# Patient Record
Sex: Female | Born: 2000 | Race: Black or African American | Hispanic: No | Marital: Single | Smoking: Never smoker
Health system: Southern US, Community
[De-identification: ages and names within clinical notes are randomized; demographics above are authoritative.]

## PROBLEM LIST (undated history)

## (undated) DIAGNOSIS — J302 Other seasonal allergic rhinitis: Secondary | ICD-10-CM

## (undated) HISTORY — PX: OTHER SURGICAL HISTORY: SHX169

---
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
## 2019-01-31 DIAGNOSIS — H5213 Myopia, bilateral: Secondary | ICD-10-CM | POA: Diagnosis not present

## 2019-02-03 DIAGNOSIS — H5213 Myopia, bilateral: Secondary | ICD-10-CM | POA: Diagnosis not present

## 2019-02-10 DIAGNOSIS — Z113 Encounter for screening for infections with a predominantly sexual mode of transmission: Secondary | ICD-10-CM | POA: Diagnosis not present

## 2019-02-10 DIAGNOSIS — Z30014 Encounter for initial prescription of intrauterine contraceptive device: Secondary | ICD-10-CM | POA: Diagnosis not present

## 2019-03-10 DIAGNOSIS — H5213 Myopia, bilateral: Secondary | ICD-10-CM | POA: Diagnosis not present

## 2019-03-10 DIAGNOSIS — H52223 Regular astigmatism, bilateral: Secondary | ICD-10-CM | POA: Diagnosis not present

## 2019-05-31 DIAGNOSIS — Z20822 Contact with and (suspected) exposure to covid-19: Secondary | ICD-10-CM | POA: Diagnosis not present

## 2020-06-16 DIAGNOSIS — Z20822 Contact with and (suspected) exposure to covid-19: Secondary | ICD-10-CM | POA: Diagnosis not present

## 2020-06-20 ENCOUNTER — Emergency Department (HOSPITAL_COMMUNITY)
Admission: EM | Admit: 2020-06-20 | Discharge: 2020-06-20 | Disposition: A | Discharge status: Home / Self Care | Payer: Medicaid Other | Attending: Emergency Medicine | Admitting: Emergency Medicine

## 2020-06-20 ENCOUNTER — Other Ambulatory Visit: Payer: Self-pay

## 2020-06-20 ENCOUNTER — Encounter (HOSPITAL_COMMUNITY): Payer: Self-pay

## 2020-06-20 DIAGNOSIS — L02213 Cutaneous abscess of chest wall: Secondary | ICD-10-CM | POA: Insufficient documentation

## 2020-06-20 MED ORDER — SULFAMETHOXAZOLE-TRIMETHOPRIM 800-160 MG PO TABS: 1.0000 | ORAL_TABLET | Freq: Two times a day (BID) | ORAL | 0 refills | Status: AC

## 2020-06-20 MED ORDER — SULFAMETHOXAZOLE-TRIMETHOPRIM 800-160 MG PO TABS
1.0000 | ORAL_TABLET | Freq: Once | ORAL | Status: AC
  Administered 2020-06-20: 1 via ORAL
  Filled 2020-06-20: qty 1

## 2020-06-20 NOTE — ED Provider Notes (Signed)
[facility]-EMERGENCY DEPT Provider Note   CSN: [id number] Arrival date & time: 06/20/20  2016     History Chief Complaint  Patient presents with  . Breast Pain    [facility] is a 20 y.o. female with past medical history who presents for evaluation of left breast pain intermittently x3 months.  States she feels like intermittently there is a lump on the left mid central chest wall.  States he has not noted a change in size.  Today became painful.  She denies any redness, swelling or warmth to her breast.  No prior pregnancies or breast-feeding.  She has not noted any changes or inversion to her nipples.  No history of breast cancer in early age. Pain worse when she "wear a tight bra." Rates her current pain a 4/10.  She has not taken anything for this.  She denies fever, chills, nausea, vomiting, chest pain, shortness of breath, cough, hemoptysis abdominal pain, diarrhea, dysuria, unilateral leg swelling, redness or warmth.  Denies additional aggravating or alleviating factors.  History obtained from patient and past medical records.  No interpreter used.  HPI     Past Medical History:  Diagnosis Date  . Seasonal allergies     There are no problems to display for this patient.   Past Surgical History:  Procedure Laterality Date  . tubes in ears       OB History   No obstetric history on file.     History reviewed. No pertinent family history.  Social History   Tobacco Use  . Smoking status: Never Smoker  . Smokeless tobacco: Never Used  Vaping Use  . Vaping Use: Never used  Substance Use Topics  . Alcohol use: Not Currently  . Drug use: Not Currently    Home Medications Prior to Admission medications   Medication Sig Start Date End Date Taking? Authorizing Provider  sulfamethoxazole-trimethoprim (BACTRIM DS) 800-160 MG tablet Take 1 tablet by mouth 2 (two) times daily for 7 days. 06/20/20 06/27/20 Yes Benny Deutschman A, PA-C   acetaminophen (TYLENOL) 100 MG/ML solution Take 10 mg/kg by mouth every 4 (four) hours as needed.    [provider]  famotidine (PEPCID) 20 MG tablet Take 1 tablet (20 mg total) by mouth 2 (two) times daily. 06/02/16   Lowanda Foster, NP    Allergies    Patient has no known allergies.  Review of Systems   Review of Systems  Constitutional: Negative.   HENT: Negative.   Respiratory: Negative.   Cardiovascular:       Left breast pain  Gastrointestinal: Negative.   Genitourinary: Negative.   Musculoskeletal: Negative.   Skin: Negative.   Neurological: Negative.   All other systems reviewed and are negative.   Physical Exam Updated Vital Signs BP 140/80 (BP Location: Left Arm)   Pulse 87   Temp 98.3 F (36.8 C) (Oral)   Resp 16   Ht 5\' 7"  (1.702 m)   Wt 118.4 kg   LMP 05/31/2020   SpO2 99%   BMI 40.88 kg/m   Physical Exam Vitals and nursing note reviewed. Exam conducted with a chaperone present.  Constitutional:      General: She is not in acute distress.    Appearance: She is well-developed. She is not ill-appearing, toxic-appearing or diaphoretic.  HENT:     Head: Normocephalic and atraumatic.     Nose: Nose normal.     Mouth/Throat:     Mouth: Mucous membranes are moist.  Eyes:     Pupils: Pupils are equal, round, and reactive to light.  Cardiovascular:     Rate and Rhythm: Normal rate.     Pulses: Normal pulses.     Heart sounds: Normal heart sounds.  Pulmonary:     Effort: Pulmonary effort is normal. No respiratory distress.     Breath sounds: Normal breath sounds.  Chest:     Chest wall: Tenderness present. No lacerations or edema.  Breasts:     Right: No swelling, bleeding, inverted nipple, mass, nipple discharge, skin change, tenderness, axillary adenopathy or supraclavicular adenopathy.     Left: No swelling, bleeding, inverted nipple, mass, nipple discharge, skin change, tenderness, axillary adenopathy or supraclavicular adenopathy.         Comments: RN present for exam. 1 cm firm mobile, tenderness mass to left central breast crease.  No fluctuance.  No bleeding or drainage Abdominal:     General: There is no distension.  Musculoskeletal:        General: Normal range of motion.     Cervical back: Normal range of motion.  Lymphadenopathy:     Upper Body:     Right upper body: No supraclavicular, axillary or pectoral adenopathy.     Left upper body: No supraclavicular, axillary or pectoral adenopathy.  Skin:    General: Skin is warm and dry.     Capillary Refill: Capillary refill takes less than 2 seconds.     Comments: 1cm firm, mobile erythematous mass to the left central breast crease at chest wall.  No fluctuance. Overlying erythema present.  Neurological:     General: No focal deficit present.     Mental Status: She is alert and oriented to person, place, and time.     ED Results / Procedures / Treatments   Labs (all labs ordered are listed, but only abnormal results are displayed) Labs Reviewed - No data to display  EKG None  Radiology No results found.  Procedures Procedures   Medications Ordered in ED Medications  sulfamethoxazole-trimethoprim (BACTRIM DS) 800-160 MG per tablet 1 tablet (has no administration in time range)    ED Course  I have reviewed the triage vital signs and the nursing notes.  Pertinent labs & imaging results that were available during my care of the patient were reviewed by me and considered in my medical decision making (see chart for details).  20 year old female presents for evaluation of left breast pain. She is afebrile, nonseptic, not ill changes.  1cm firm, mobile mass to left breast crease with surrounding erythema and tenderness.  No fluctuance to suggest drainable abscess.  Seems more consistent with chest wall infected cyst vs early non drainable abscess.  No nipple inversion, peu de orange, large abscess on exam. Given antibiotics here in ED.  Discussed warm compress,  antibiotics and follow-up in 2 days for wound recheck.  She is agreeable.  The patient has been appropriately medically screened and/or stabilized in the ED. I have low suspicion for any other emergent medical condition which would require further screening, evaluation or treatment in the ED or require inpatient management.  Patient is hemodynamically stable and in no acute distress.  Patient able to ambulate in department prior to ED.  Evaluation does not show acute pathology that would require ongoing or additional emergent interventions while in the emergency department or further inpatient treatment.  I have discussed the diagnosis with the patient and answered all questions.  Pain is been managed while in the emergency  department and patient has no further complaints prior to discharge.  Patient is comfortable with plan discussed in room and is stable for discharge at this time.  I have discussed strict return precautions for returning to the emergency department.  Patient was encouraged to follow-up with PCP/specialist refer to at discharge.    MDM Rules/Calculators/A&P                           Final Clinical Impression(s) / ED Diagnoses Final diagnoses:  Chest wall abscess    Rx / DC Orders ED Discharge Orders         Ordered    sulfamethoxazole-trimethoprim (BACTRIM DS) 800-160 MG tablet  2 times daily        06/20/20 2116           Ollie Delano A, PA-C 06/20/20 2117    Linwood Dibbles, MD 06/21/20 [phone]

## 2020-06-20 NOTE — Discharge Instructions (Signed)
Take the antibiotics as prescribed  Warm compress to area  This area will typically come to ahead  Follow-up in 2 days with primary care provider  May take Tylenol and ibuprofen as needed for pain.  Do not take more than 4000 mg Tylenol or more than 1200 mg ibuprofen in a 24.  Return for new or worsening symptoms.

## 2020-06-20 NOTE — ED Triage Notes (Signed)
Pt c/o breast pain x several months, states it feels like there is a lump on the left side. Denies fevers, states it hasn't changed in size but has been more painful recently

## 2020-06-21 ENCOUNTER — Telehealth: Payer: Self-pay

## 2020-06-21 NOTE — Telephone Encounter (Signed)
Transition Care Management Unsuccessful Follow-up Telephone Call  Date of discharge and from where:  03./23/2022 from [facility]  Attempts:  1st Attempt  Reason for unsuccessful TCM follow-up call:  Unable to leave message

## 2020-06-22 NOTE — Telephone Encounter (Signed)
Transition Care Management Unsuccessful Follow-up Telephone Call  Date of discharge and from where:  06/20/2020 from [facility]  Attempts:  2nd Attempt  Reason for unsuccessful TCM follow-up call:  Unable to leave message

## 2020-06-25 NOTE — Telephone Encounter (Signed)
Transition Care Management Unsuccessful Follow-up Telephone Call  Date of discharge and from where:  06/20/2020 [facility]  Attempts:  3rd Attempt  Reason for unsuccessful TCM follow-up call:  Unable to leave message

## 2020-10-24 DIAGNOSIS — Z113 Encounter for screening for infections with a predominantly sexual mode of transmission: Secondary | ICD-10-CM | POA: Diagnosis not present

## 2020-10-24 DIAGNOSIS — Z114 Encounter for screening for human immunodeficiency virus [HIV]: Secondary | ICD-10-CM | POA: Diagnosis not present

## 2020-10-24 DIAGNOSIS — B373 Candidiasis of vulva and vagina: Secondary | ICD-10-CM | POA: Diagnosis not present

## 2020-12-30 DIAGNOSIS — I10 Essential (primary) hypertension: Secondary | ICD-10-CM | POA: Insufficient documentation

## 2020-12-30 DIAGNOSIS — R200 Anesthesia of skin: Secondary | ICD-10-CM | POA: Diagnosis not present

## 2020-12-30 DIAGNOSIS — R202 Paresthesia of skin: Secondary | ICD-10-CM | POA: Diagnosis not present

## 2020-12-30 DIAGNOSIS — R600 Localized edema: Secondary | ICD-10-CM | POA: Diagnosis not present

## 2020-12-30 DIAGNOSIS — G5711 Meralgia paresthetica, right lower limb: Secondary | ICD-10-CM | POA: Insufficient documentation

## 2020-12-30 DIAGNOSIS — F444 Conversion disorder with motor symptom or deficit: Secondary | ICD-10-CM | POA: Diagnosis not present

## 2020-12-30 DIAGNOSIS — M79604 Pain in right leg: Secondary | ICD-10-CM | POA: Diagnosis present

## 2020-12-30 DIAGNOSIS — M4804 Spinal stenosis, thoracic region: Secondary | ICD-10-CM | POA: Diagnosis not present

## 2020-12-30 DIAGNOSIS — D649 Anemia, unspecified: Secondary | ICD-10-CM | POA: Insufficient documentation

## 2020-12-30 DIAGNOSIS — F419 Anxiety disorder, unspecified: Secondary | ICD-10-CM | POA: Insufficient documentation

## 2020-12-30 DIAGNOSIS — Z20822 Contact with and (suspected) exposure to covid-19: Secondary | ICD-10-CM | POA: Insufficient documentation

## 2020-12-30 DIAGNOSIS — G35 Multiple sclerosis: Secondary | ICD-10-CM | POA: Diagnosis not present

## 2020-12-31 ENCOUNTER — Inpatient Hospital Stay (HOSPITAL_COMMUNITY): Payer: Medicaid Other

## 2020-12-31 ENCOUNTER — Emergency Department (HOSPITAL_COMMUNITY): Payer: Medicaid Other

## 2020-12-31 ENCOUNTER — Observation Stay (HOSPITAL_COMMUNITY)
Admission: EM | Admit: 2020-12-31 | Discharge: 2021-01-01 | Disposition: A | Discharge status: Home / Self Care | Payer: Medicaid Other | Attending: Internal Medicine | Admitting: Internal Medicine

## 2020-12-31 ENCOUNTER — Other Ambulatory Visit: Payer: Self-pay

## 2020-12-31 ENCOUNTER — Encounter (HOSPITAL_COMMUNITY): Payer: Self-pay | Admitting: Emergency Medicine

## 2020-12-31 DIAGNOSIS — M791 Myalgia, unspecified site: Secondary | ICD-10-CM

## 2020-12-31 DIAGNOSIS — M79605 Pain in left leg: Secondary | ICD-10-CM | POA: Diagnosis not present

## 2020-12-31 DIAGNOSIS — M79662 Pain in left lower leg: Secondary | ICD-10-CM | POA: Diagnosis not present

## 2020-12-31 DIAGNOSIS — M79604 Pain in right leg: Secondary | ICD-10-CM | POA: Diagnosis present

## 2020-12-31 DIAGNOSIS — M4804 Spinal stenosis, thoracic region: Secondary | ICD-10-CM | POA: Diagnosis not present

## 2020-12-31 DIAGNOSIS — M545 Low back pain, unspecified: Secondary | ICD-10-CM | POA: Diagnosis not present

## 2020-12-31 DIAGNOSIS — G35 Multiple sclerosis: Secondary | ICD-10-CM | POA: Diagnosis not present

## 2020-12-31 DIAGNOSIS — M79661 Pain in right lower leg: Secondary | ICD-10-CM | POA: Diagnosis not present

## 2020-12-31 DIAGNOSIS — R29898 Other symptoms and signs involving the musculoskeletal system: Secondary | ICD-10-CM

## 2020-12-31 DIAGNOSIS — M79606 Pain in leg, unspecified: Secondary | ICD-10-CM | POA: Diagnosis not present

## 2020-12-31 DIAGNOSIS — R200 Anesthesia of skin: Secondary | ICD-10-CM

## 2020-12-31 DIAGNOSIS — I10 Essential (primary) hypertension: Secondary | ICD-10-CM | POA: Diagnosis not present

## 2020-12-31 DIAGNOSIS — R600 Localized edema: Secondary | ICD-10-CM | POA: Diagnosis not present

## 2020-12-31 LAB — BASIC METABOLIC PANEL
Anion gap: 6 (ref 5–15)
BUN: 11 mg/dL (ref 6–20)
CO2: 24 mmol/L (ref 22–32)
Calcium: 9.3 mg/dL (ref 8.9–10.3)
Chloride: 104 mmol/L (ref 98–111)
Creatinine, Ser: 0.66 mg/dL (ref 0.44–1.00)
GFR, Estimated: >60 mL/min (ref >60)
Glucose, Bld: 89 mg/dL (ref 70–99)
Potassium: 4.3 mmol/L (ref 3.5–5.1)
Sodium: 134 mmol/L — ABNORMAL LOW (ref 135–145)

## 2020-12-31 LAB — FOLATE: Folate: 16.4 ng/mL (ref >5.9)

## 2020-12-31 LAB — IRON AND TIBC
Iron: 97 ug/dL (ref 28–170)
Saturation Ratios: 18 % (ref 10.4–31.8)
TIBC: 528 ug/dL — ABNORMAL HIGH (ref 250–450)
UIBC: 431 ug/dL

## 2020-12-31 LAB — HEPATIC FUNCTION PANEL
ALT: 14 U/L (ref 0–44)
AST: 17 U/L (ref 15–41)
Albumin: 3.8 g/dL (ref 3.5–5.0)
Alkaline Phosphatase: 58 U/L (ref 38–126)
Bilirubin, Direct: 0.1 mg/dL (ref 0.0–0.2)
Indirect Bilirubin: 1.1 mg/dL — ABNORMAL HIGH (ref 0.3–0.9)
Total Bilirubin: 1.2 mg/dL (ref 0.3–1.2)
Total Protein: 7.6 g/dL (ref 6.5–8.1)

## 2020-12-31 LAB — CBC
HCT: 35.9 % — ABNORMAL LOW (ref 36.0–46.0)
Hemoglobin: 11 g/dL — ABNORMAL LOW (ref 12.0–15.0)
MCH: 24.4 pg — ABNORMAL LOW (ref 26.0–34.0)
MCHC: 30.6 g/dL (ref 30.0–36.0)
MCV: 79.6 fL — ABNORMAL LOW (ref 80.0–100.0)
Platelets: 360 10*3/uL (ref 150–400)
RBC: 4.51 MIL/uL (ref 3.87–5.11)
RDW: 14.9 % (ref 11.5–15.5)
WBC: 9.7 10*3/uL (ref 4.0–10.5)
nRBC: 0 % (ref 0.0–0.2)

## 2020-12-31 LAB — HIV ANTIBODY (ROUTINE TESTING W REFLEX): HIV Screen 4th Generation wRfx: NONREACTIVE

## 2020-12-31 LAB — I-STAT BETA HCG BLOOD, ED (MC, WL, AP ONLY): I-stat hCG, quantitative: <5 m[IU]/mL (ref <5)

## 2020-12-31 LAB — CK: Total CK: 91 U/L (ref 38–234)

## 2020-12-31 LAB — RESP PANEL BY RT-PCR (FLU A&B, COVID) ARPGX2
Influenza A by PCR: NEGATIVE
Influenza B by PCR: NEGATIVE
SARS Coronavirus 2 by RT PCR: NEGATIVE

## 2020-12-31 LAB — FERRITIN: Ferritin: 14 ng/mL (ref 11–307)

## 2020-12-31 LAB — VITAMIN B12: Vitamin B-12: 402 pg/mL (ref 180–914)

## 2020-12-31 LAB — HEMOGLOBIN A1C
Hgb A1c MFr Bld: 5.1 % (ref 4.8–5.6)
Mean Plasma Glucose: 99.67 mg/dL

## 2020-12-31 LAB — TSH: TSH: 2.033 u[IU]/mL (ref 0.350–4.500)

## 2020-12-31 IMAGING — MR MR THORACIC SPINE WO/W CM
4 of 9 series · 18 of 48 positions shown · IV contrast (gadavist)
Comparison: Chest radiographs [DATE].

CLINICAL DATA: Spinal stenosis. Suspicion of demyelinating disease.
Left leg numbness for 1 week with right-sided symptoms today.

EXAM:
MRI CERVICAL AND THORACIC SPINE WITHOUT AND WITH CONTRAST
TECHNIQUE: Multiplanar and multiecho pulse sequences of the cervical spine, to
include the craniocervical junction and cervicothoracic junction,
and the thoracic spine, were obtained without and with intravenous
contrast.
CONTRAST:  10mL GADAVIST GADOBUTROL 1 MMOL/ML IV SOLN

[Series 3: T1 · sagittal · 3.5mm · 0.90mm/px · 4 of 15 slices shown (1 of 2)]
[im 1/15]
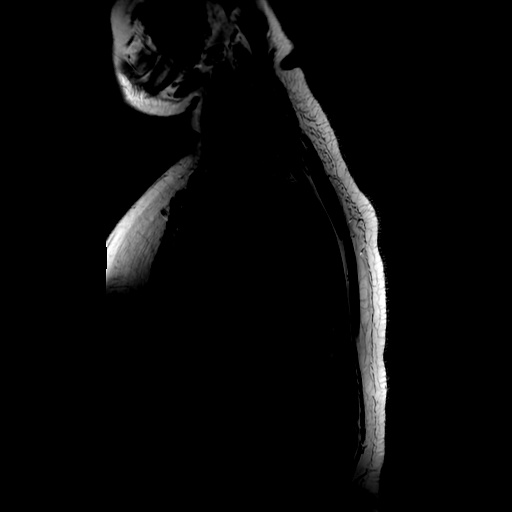
[im 5/15]
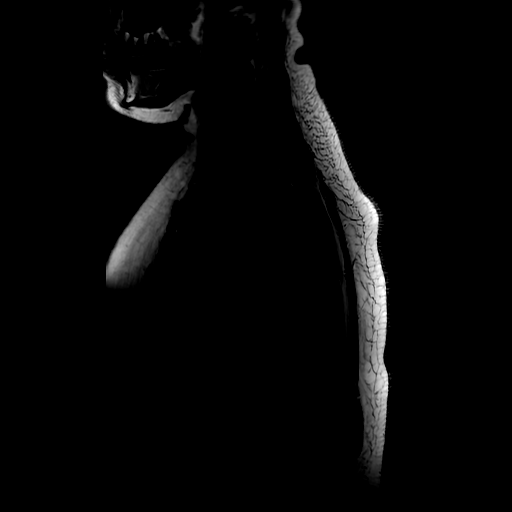
[im 10/15]
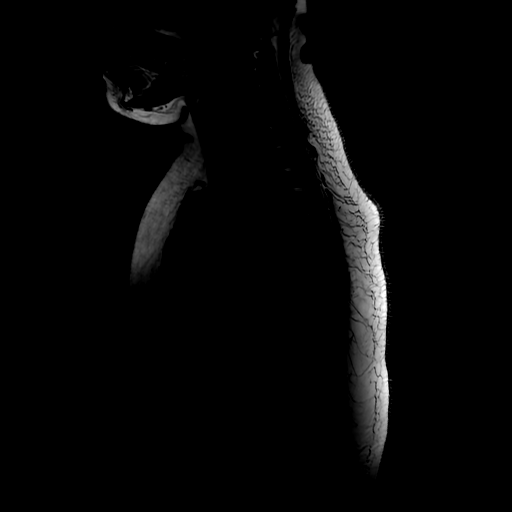
[im 15/15]
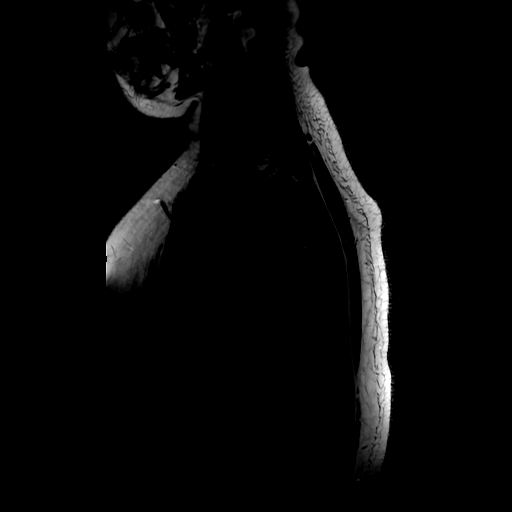

[Series 19: T2 · sagittal · 3.0mm · 0.66mm/px · 3 of 15 slices shown (1 of 2)]
[im 1/15]
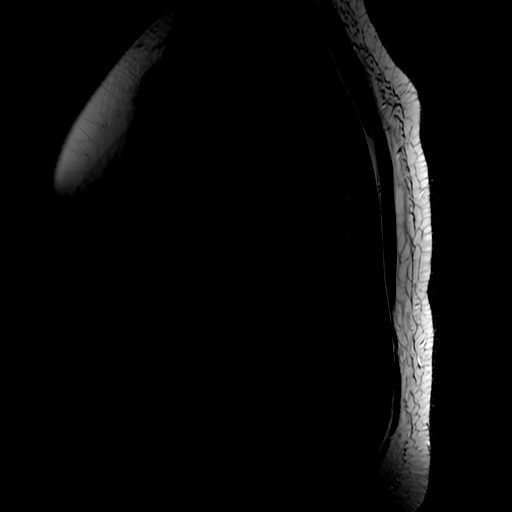
[im 8/15]
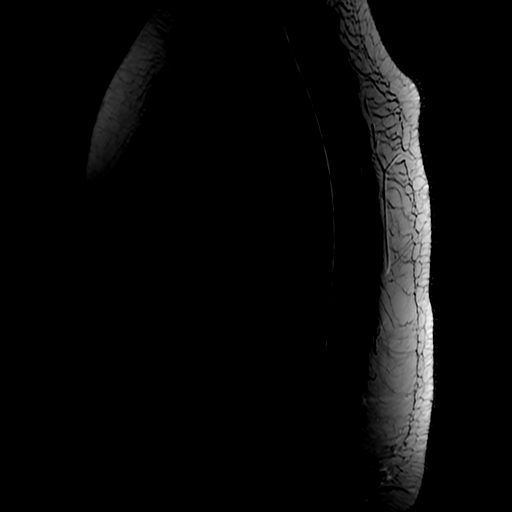
[im 15/15]
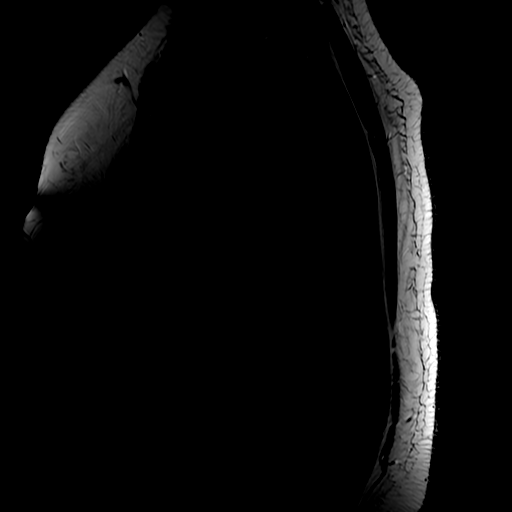

[Series 21: T1 · sagittal · 3.0mm · 0.66mm/px · 3 of 15 slices shown (2 of 2)]
[im 1/15]
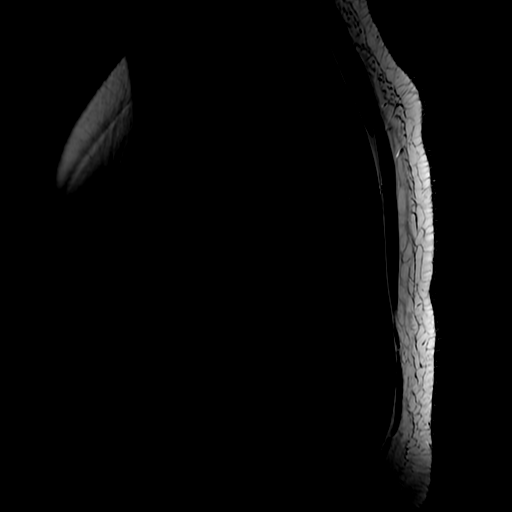
[im 8/15]
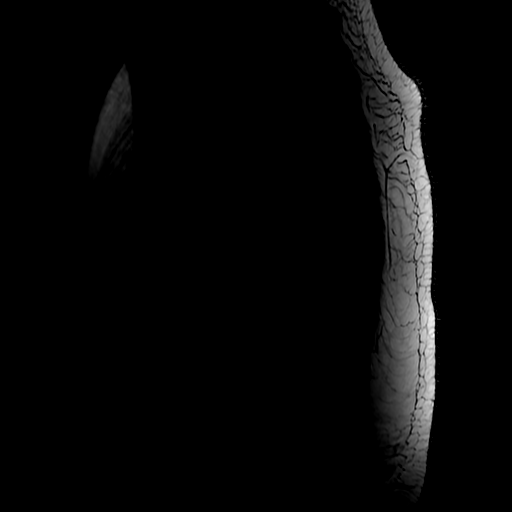
[im 15/15]
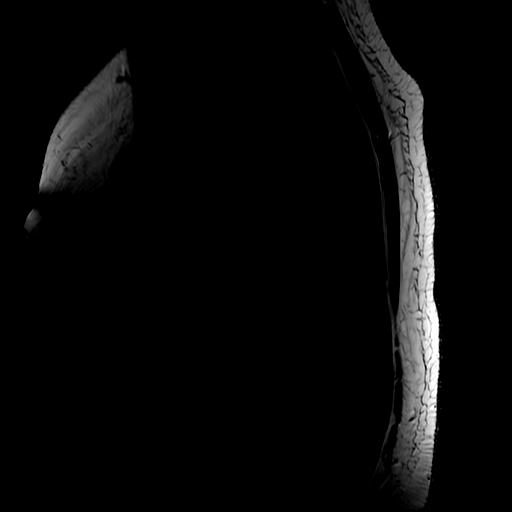

[Series 22: T2 · axial · 4.0mm · 0.39mm/px · z∈[-450,-209]mm · 8 of 39 slices shown (2 of 2)]
[im 1/39]
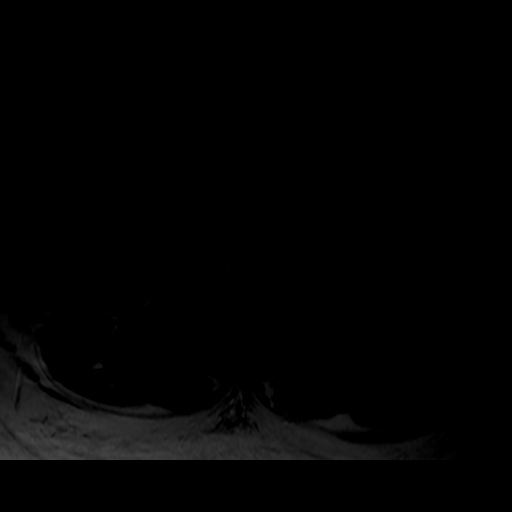
[im 6/39]
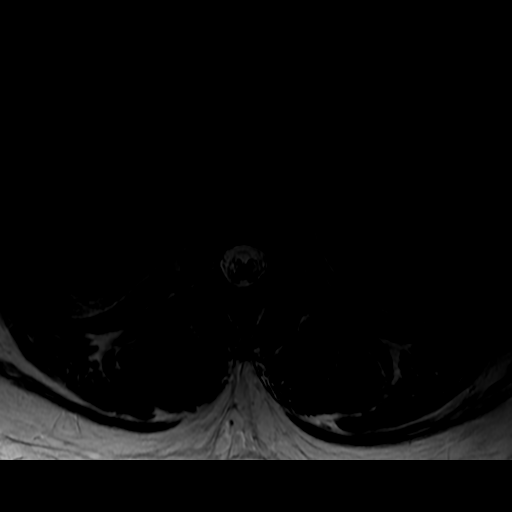
[im 11/39]
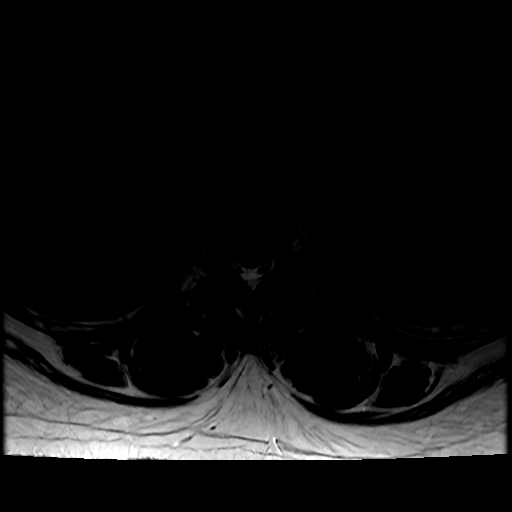
[im 17/39]
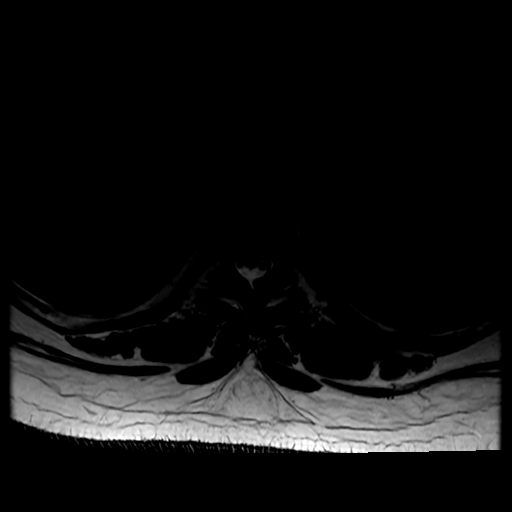
[im 22/39]
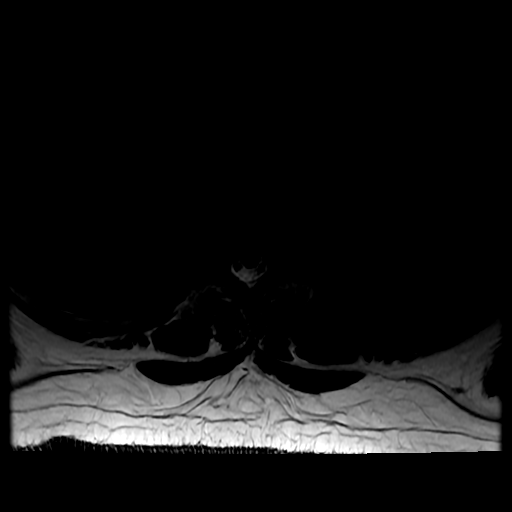
[im 28/39]
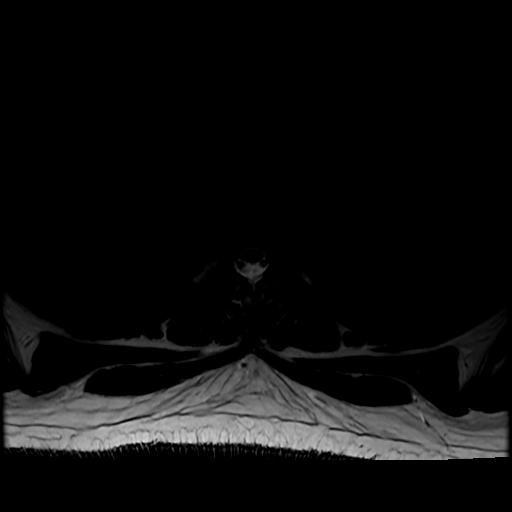
[im 33/39]
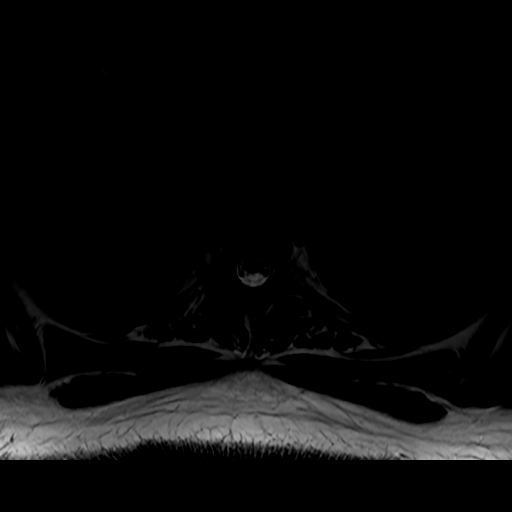
[im 39/39]
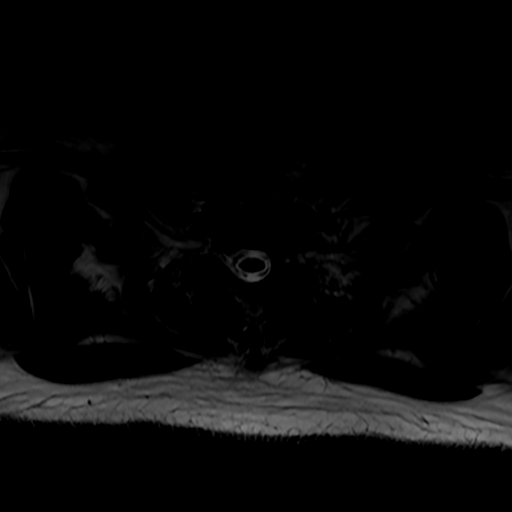

[18 of 48 positions shown; findings below may reference images not displayed]

FINDINGS: MRI CERVICAL SPINE FINDINGS

Alignment: Physiologic.

Vertebrae: No acute or suspicious osseous findings. No abnormal
osseous enhancement.

Cord: Normal in signal and caliber. No abnormal intradural
enhancement.

Posterior Fossa, vertebral arteries, paraspinal tissues:
Intracranial findings are dictated separately. Prominent adenoid
tissue noted. No significant paraspinal findings. Bilateral
vertebral artery flow voids.

Disc levels:

No evidence of disc herniation, spinal stenosis or nerve root
encroachment. The spinal canal and neural foramina are widely
patent.

MRI THORACIC SPINE FINDINGS

Alignment:  Physiologic.

Vertebrae: No acute or suspicious osseous findings. No abnormal
osseous enhancement.

Cord: The thoracic cord appears normal in signal and caliber. The
conus medullaris extends to the L1 level. No abnormal intradural
enhancement seen.

Paraspinal and other soft tissues: No significant paraspinal
abnormalities. Mild dependent subcutaneous edema in the upper lumbar
region.

Disc levels:

The thoracic discs appear well hydrated. No disc herniation, spinal
stenosis or nerve root encroachment. The spinal canal and neural
foramina appear widely patent.
IMPRESSION: 1. Negative MRI's of the cervical and thoracic spine. No evidence of
demyelinating process or abnormal intradural enhancement.
2. No significant spondylosis, spinal stenosis or nerve root
encroachment.
3. Prominent adenoid tissue noted.

## 2020-12-31 IMAGING — MR MR CERVICAL SPINE WO/W CM
5 of 8 series · 18 of 48 positions shown · IV contrast (10 GV)
Comparison: Chest radiographs [DATE].

CLINICAL DATA: Spinal stenosis. Suspicion of demyelinating disease.
Left leg numbness for 1 week with right-sided symptoms today.

EXAM:
MRI CERVICAL AND THORACIC SPINE WITHOUT AND WITH CONTRAST
TECHNIQUE: Multiplanar and multiecho pulse sequences of the cervical spine, to
include the craniocervical junction and cervicothoracic junction,
and the thoracic spine, were obtained without and with intravenous
contrast.
CONTRAST:  10mL GADAVIST GADOBUTROL 1 MMOL/ML IV SOLN

[Series 13: T2 · sagittal · 3.0mm · 0.43mm/px · 2 of 15 slices shown (1 of 2)]
[im 1/15]
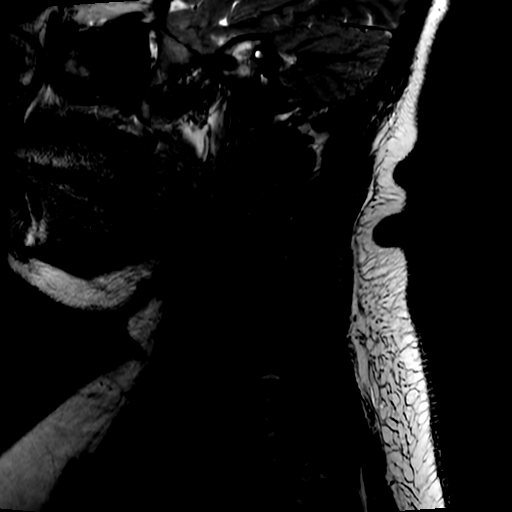
[im 15/15]
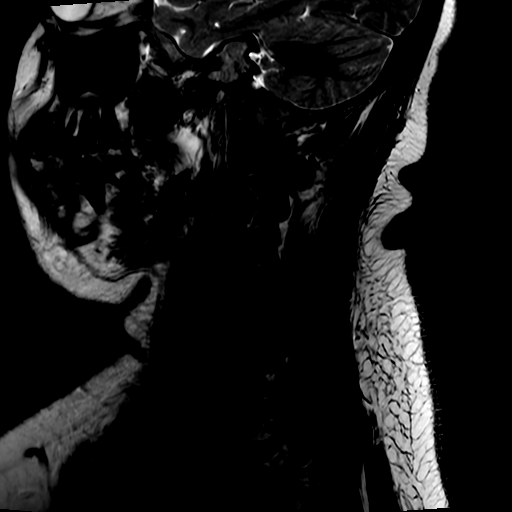

[Series 15: STIR · sagittal · 3.0mm · 0.43mm/px · 1 of 15 slices shown]
[im 1/15]
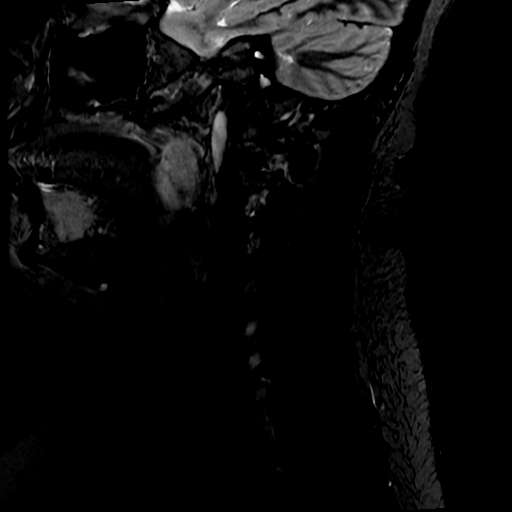

[Series 17: T2 · axial · 3.0mm · 0.35mm/px · z∈[-204,-82]mm · 6 of 37 slices shown (2 of 2)]
[im 1/37]
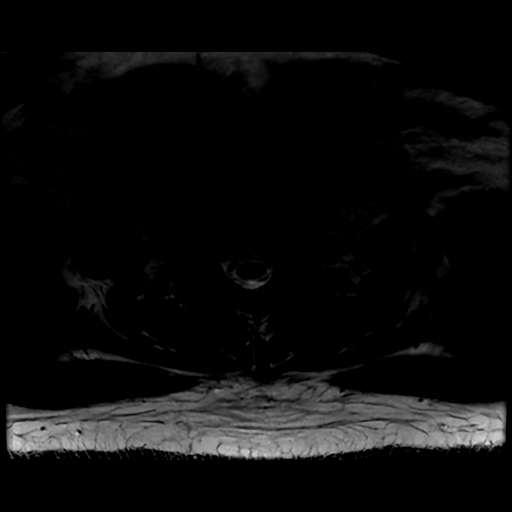
[im 8/37]
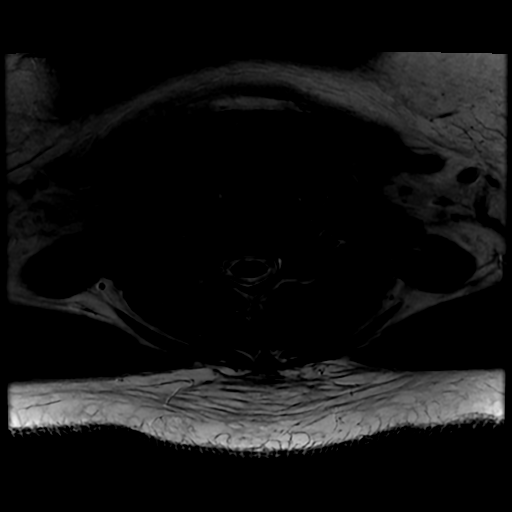
[im 15/37]
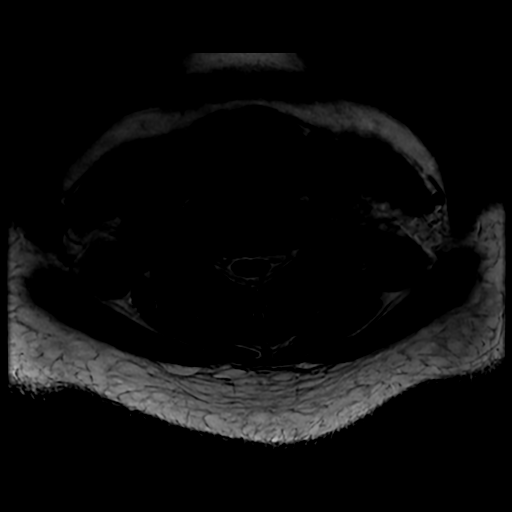
[im 22/37]
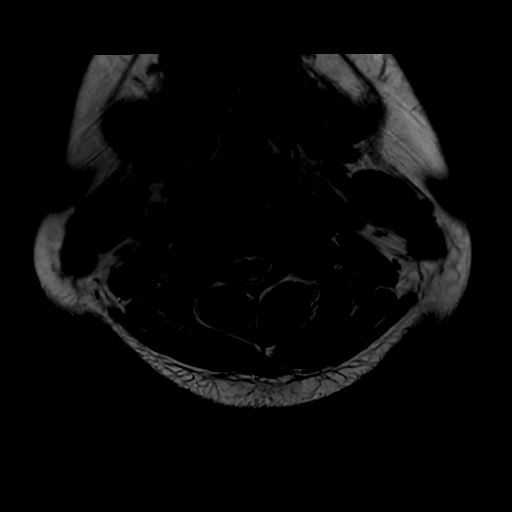
[im 29/37]
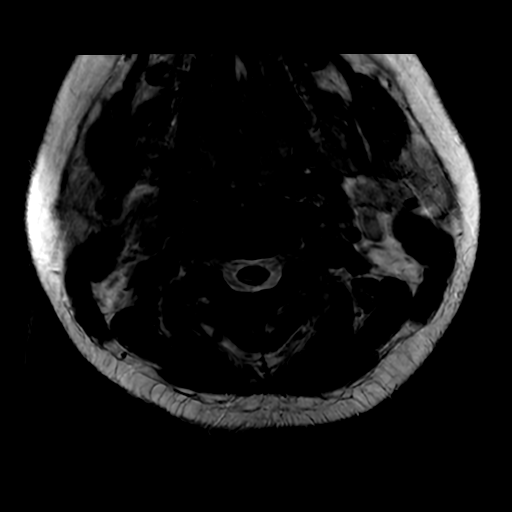
[im 37/37]
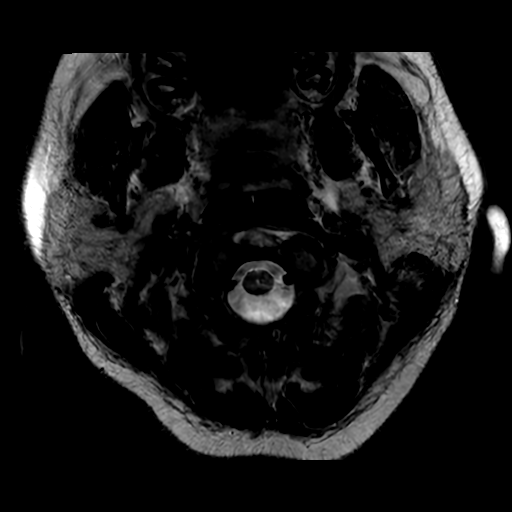

[Series 18: T1 · axial · non-contrast · 3.0mm · 0.35mm/px · z∈[-204,-82]mm · 6 of 37 slices shown]
[im 1/37]
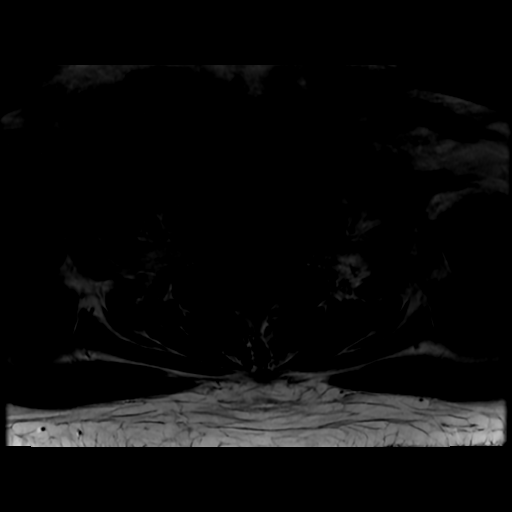
[im 8/37]
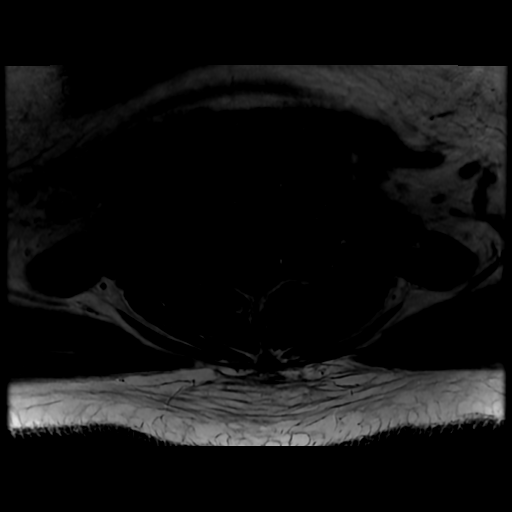
[im 15/37]
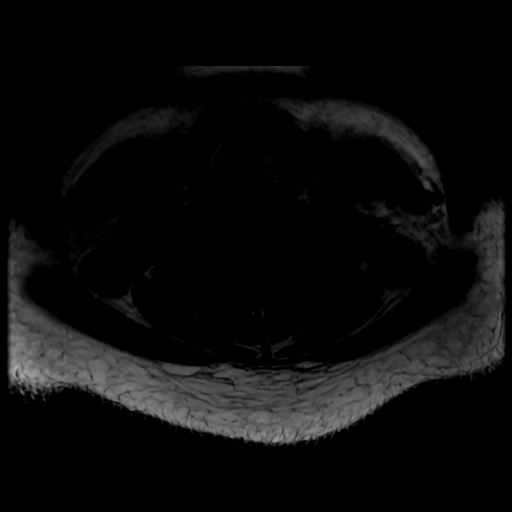
[im 22/37]
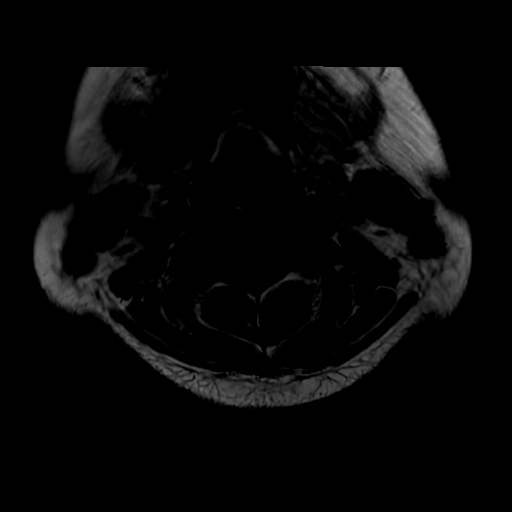
[im 29/37]
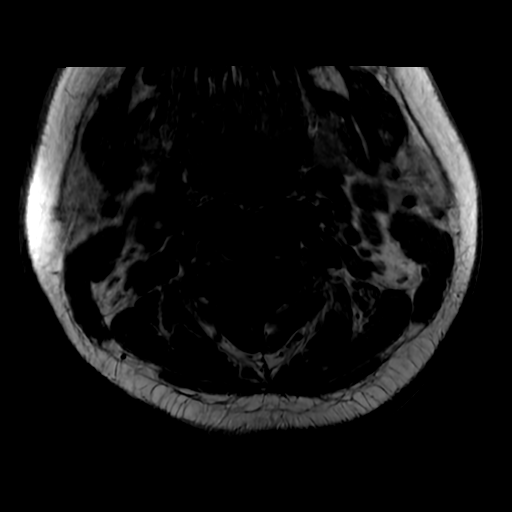
[im 37/37]
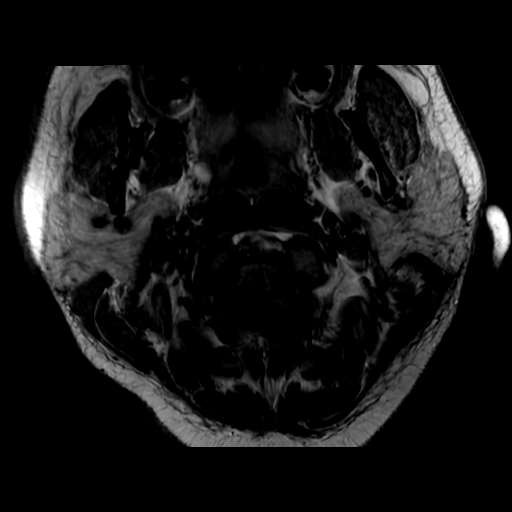

[Series 28: T1 fat-sat post-contrast · sagittal · 3.0mm · 0.43mm/px · 3 of 15 slices shown]
[im 1/15]
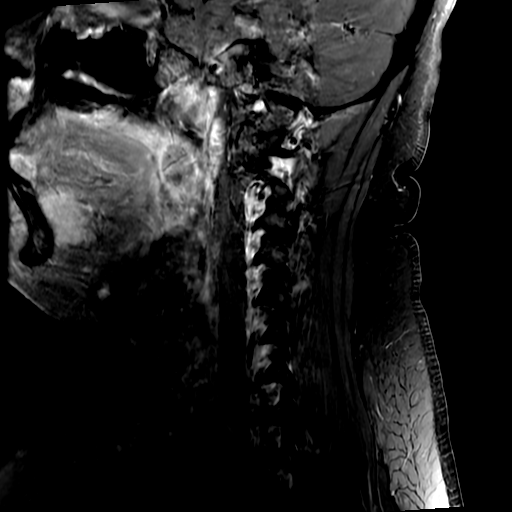
[im 8/15]
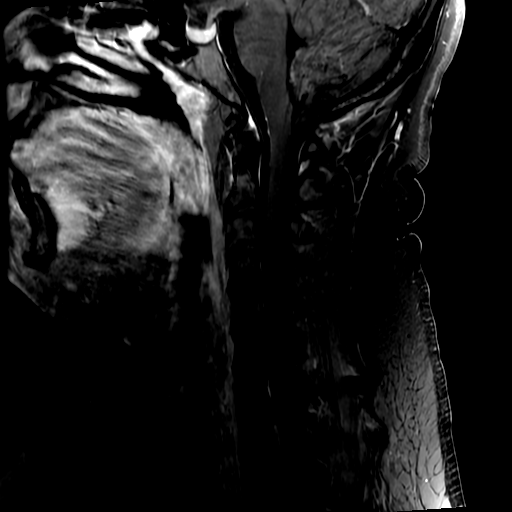
[im 15/15]
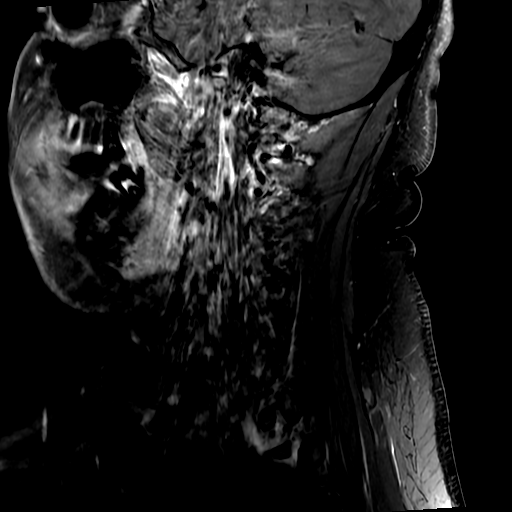

[18 of 48 positions shown; findings below may reference images not displayed]

FINDINGS: MRI CERVICAL SPINE FINDINGS

Alignment: Physiologic.

Vertebrae: No acute or suspicious osseous findings. No abnormal
osseous enhancement.

Cord: Normal in signal and caliber. No abnormal intradural
enhancement.

Posterior Fossa, vertebral arteries, paraspinal tissues:
Intracranial findings are dictated separately. Prominent adenoid
tissue noted. No significant paraspinal findings. Bilateral
vertebral artery flow voids.

Disc levels:

No evidence of disc herniation, spinal stenosis or nerve root
encroachment. The spinal canal and neural foramina are widely
patent.

MRI THORACIC SPINE FINDINGS

Alignment:  Physiologic.

Vertebrae: No acute or suspicious osseous findings. No abnormal
osseous enhancement.

Cord: The thoracic cord appears normal in signal and caliber. The
conus medullaris extends to the L1 level. No abnormal intradural
enhancement seen.

Paraspinal and other soft tissues: No significant paraspinal
abnormalities. Mild dependent subcutaneous edema in the upper lumbar
region.

Disc levels:

The thoracic discs appear well hydrated. No disc herniation, spinal
stenosis or nerve root encroachment. The spinal canal and neural
foramina appear widely patent.
IMPRESSION: 1. Negative MRI's of the cervical and thoracic spine. No evidence of
demyelinating process or abnormal intradural enhancement.
2. No significant spondylosis, spinal stenosis or nerve root
encroachment.
3. Prominent adenoid tissue noted.

## 2020-12-31 IMAGING — MR MR FEMUR*L* W/O CM
5 series · 40 of 40 positions shown · non-contrast
Comparison: None.

CLINICAL DATA: Left leg pain. History of multiple sclerosis.
Evaluate for myositis.

EXAM:
MR OF THE LEFT FEMUR WITHOUT CONTRAST
TECHNIQUE: Multiplanar, multisequence MR imaging of the left femur was
performed. No intravenous contrast was administered.

[Series 6: composed cor t1_comp_filt · coronal · left · 6.0mm · 1.25mm/px · 7 of 42 slices shown]
[im 1/42]
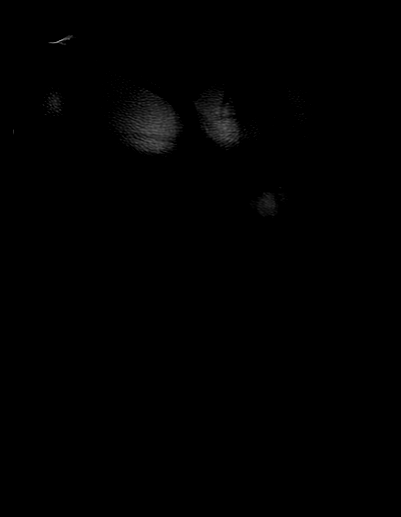
[im 7/42]
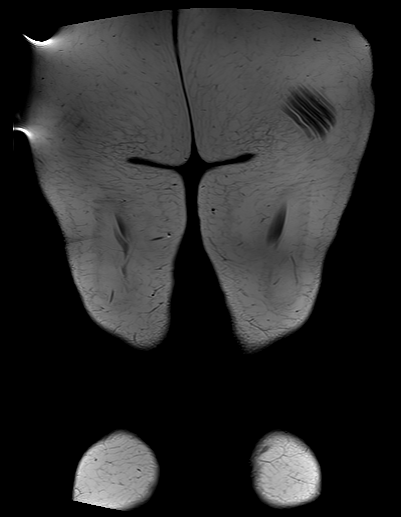
[im 14/42]
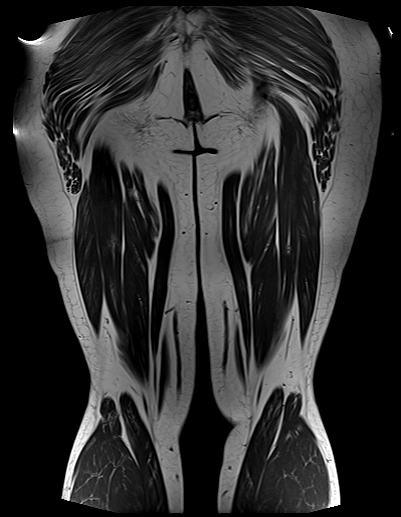
[im 21/42]
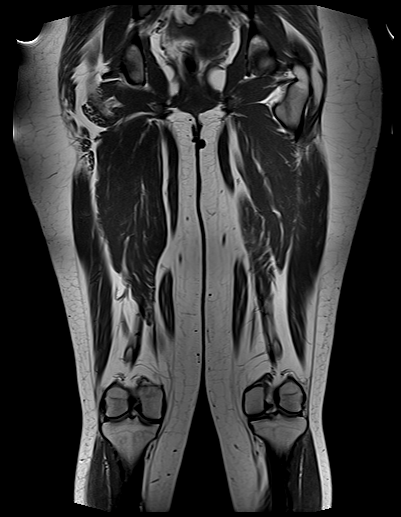
[im 28/42]
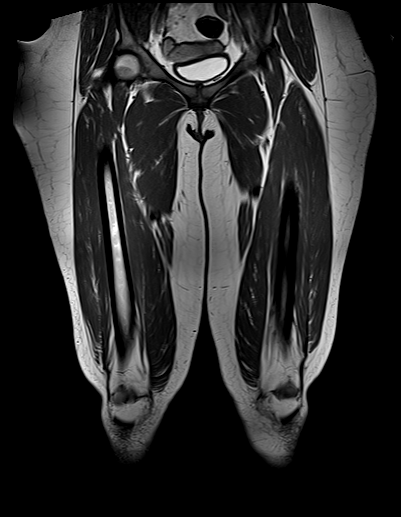
[im 35/42]
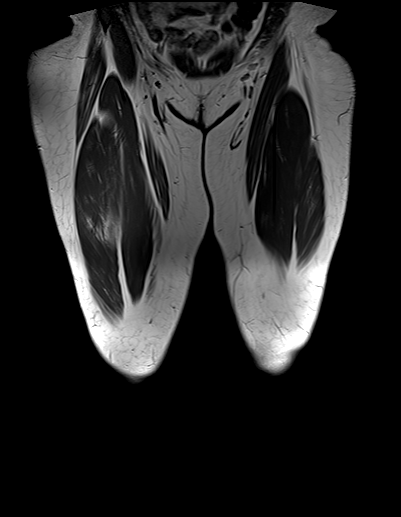
[im 42/42]
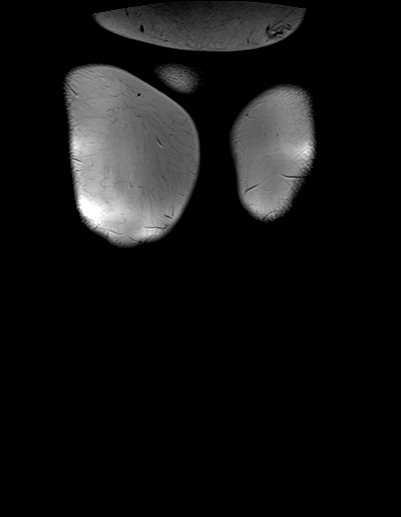

[Series 10: composed cor stir_comp_filt · coronal · left · 6.0mm · 1.25mm/px · 6 of 42 slices shown]
[im 1/42]
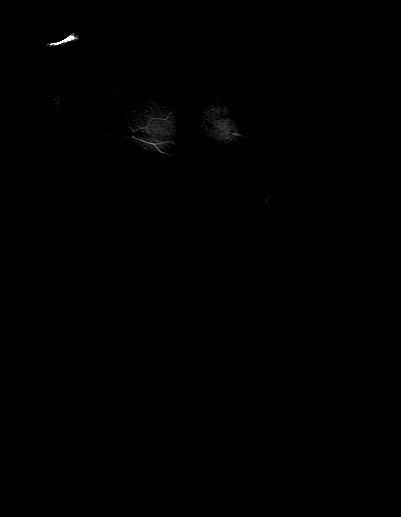
[im 9/42]
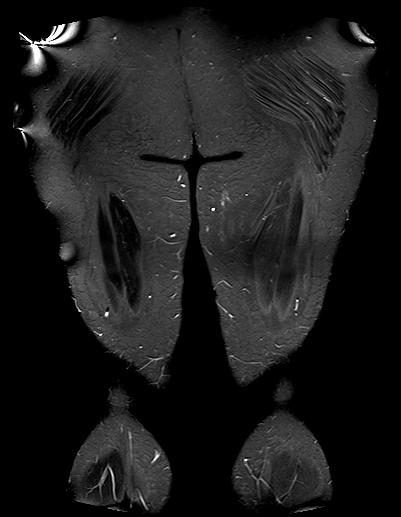
[im 17/42]
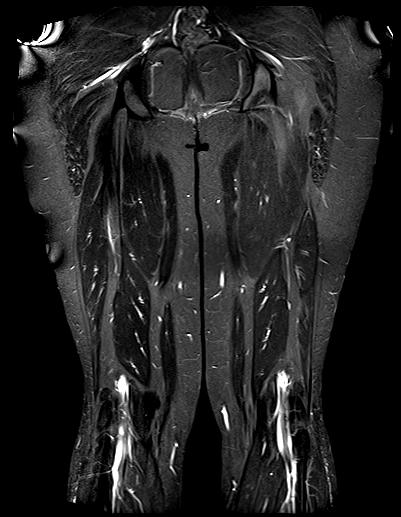
[im 25/42]
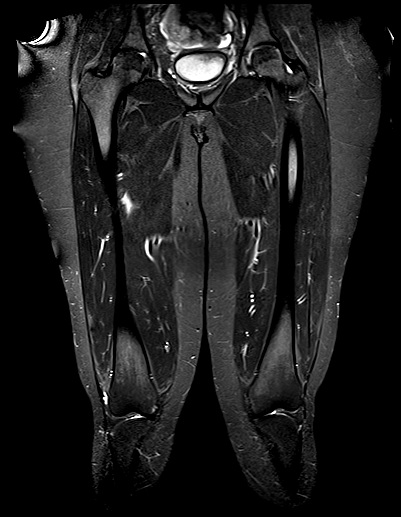
[im 33/42]
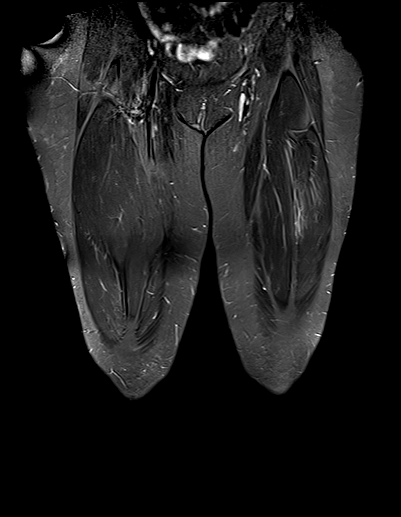
[im 42/42]
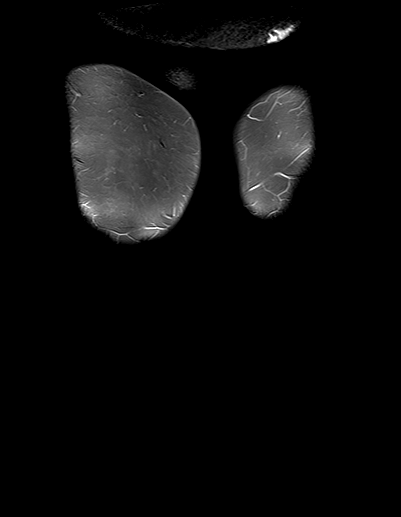

[Series 13: ax t1_comp · axial · left · 5.0mm · 1.17mm/px · z∈[-223,+269]mm · 12 of 83 slices shown]
[im 1/83]
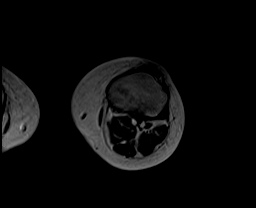
[im 8/83]
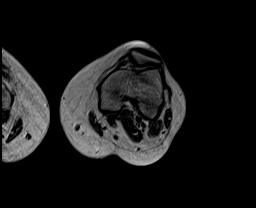
[im 15/83]
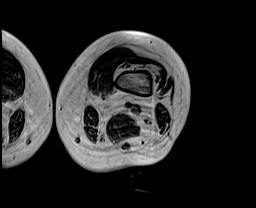
[im 23/83]
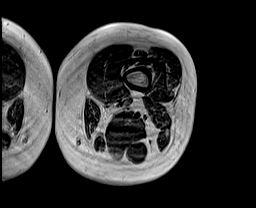
[im 30/83]
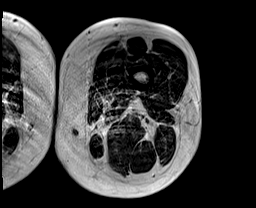
[im 38/83]
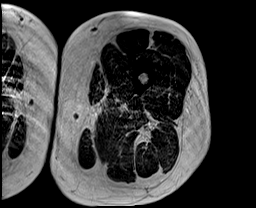
[im 45/83]
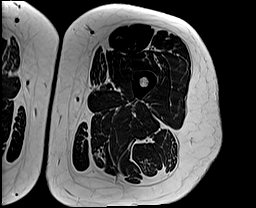
[im 53/83]
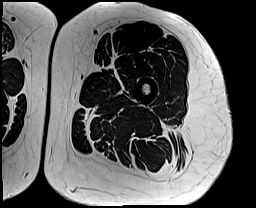
[im 60/83]
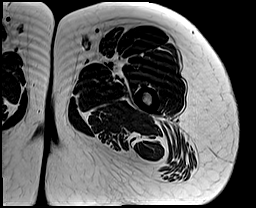
[im 68/83]
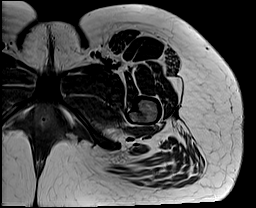
[im 75/83]
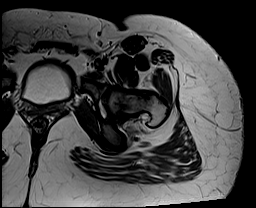
[im 83/83]
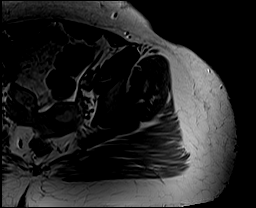

[Series 16: T2 · axial · left · 5.0mm · 1.44mm/px · z∈[-223,+269]mm · 12 of 83 slices shown (1 of 2)]
[im 1/83]
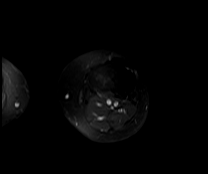
[im 8/83]
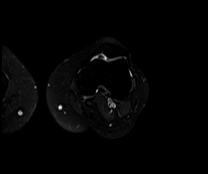
[im 15/83]
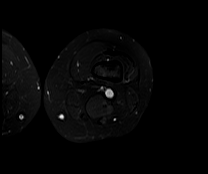
[im 23/83]
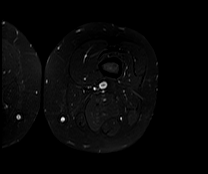
[im 30/83]
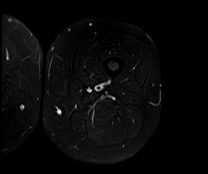
[im 38/83]
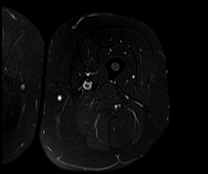
[im 45/83]
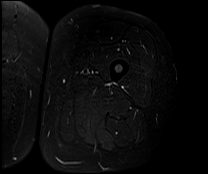
[im 53/83]
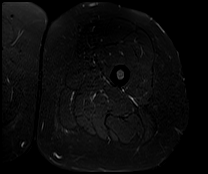
[im 60/83]
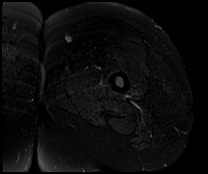
[im 68/83]
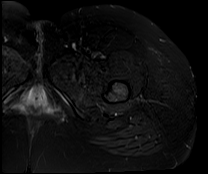
[im 75/83]
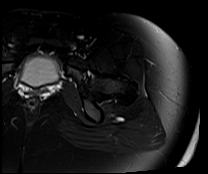
[im 83/83]
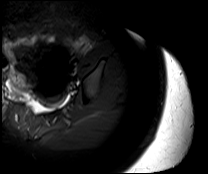

[Series 19: T2 · sagittal · left · 5.0mm · 1.20mm/px · 3 of 21 slices shown (2 of 2)]
[im 1/21]
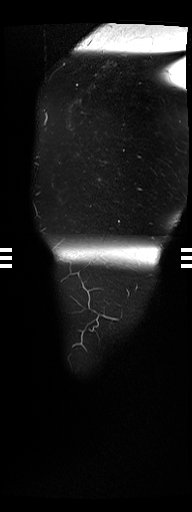
[im 11/21]
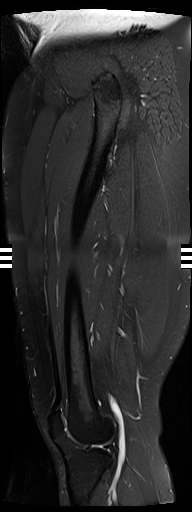
[im 21/21]
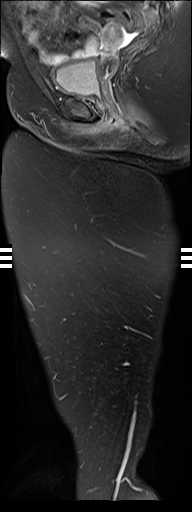

[40 of 40 positions shown; findings below may reference images not displayed]

FINDINGS: Bones/Joint/Cartilage

No marrow signal abnormality. No fracture or dislocation. Joint
spaces are preserved. No joint effusion.

Ligaments

The bilateral knee cruciate ligaments, collateral ligaments, and
menisci are grossly intact.

Muscles and Tendons
Intact.  No muscle edema or atrophy.

Soft tissue
No fluid collection or hematoma.  No soft tissue mass.
IMPRESSION: 1. Normal MRI of the left femur. No evidence of myositis.

## 2020-12-31 IMAGING — MR MR LUMBAR SPINE W/O CM
4 of 5 series · 24 of 48 positions shown · non-contrast
Comparison: None.

CLINICAL DATA: Low back pain radiating down both legs. History of
multiple sclerosis.

EXAM:
MRI LUMBAR SPINE WITHOUT CONTRAST
TECHNIQUE: Multiplanar, multisequence MR imaging of the lumbar spine was
performed. No intravenous contrast was administered.

[Series 5: T2 · sagittal · 4.0mm · 0.73mm/px · 6 of 16 slices shown (1 of 2)]
[im 1/16]
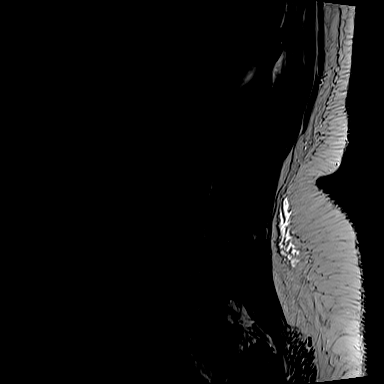
[im 4/16]
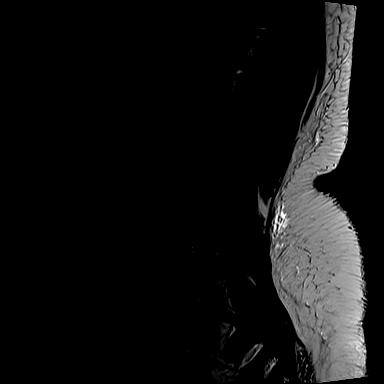
[im 7/16]
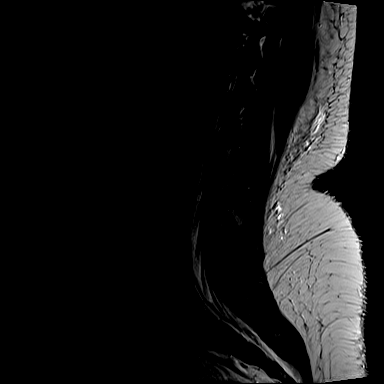
[im 10/16]
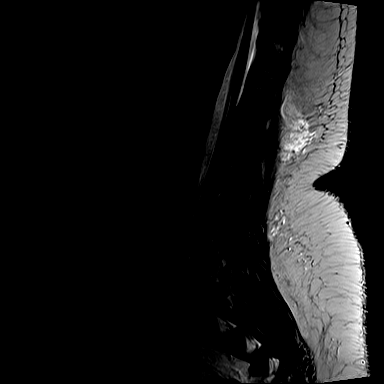
[im 13/16]
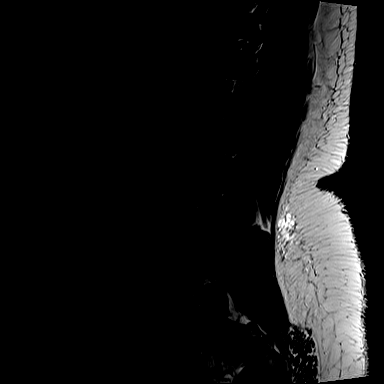
[im 16/16]
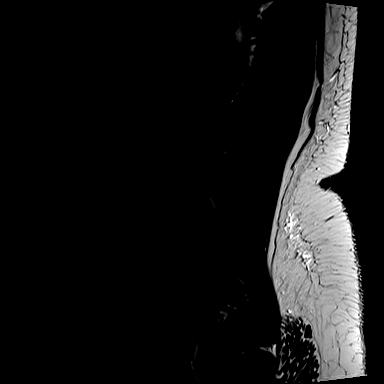

[Series 9: T1 · axial · 4.0mm · 0.34mm/px · z∈[-5,+189]mm · 7 of 34 slices shown (1 of 2)]
[im 1/34]
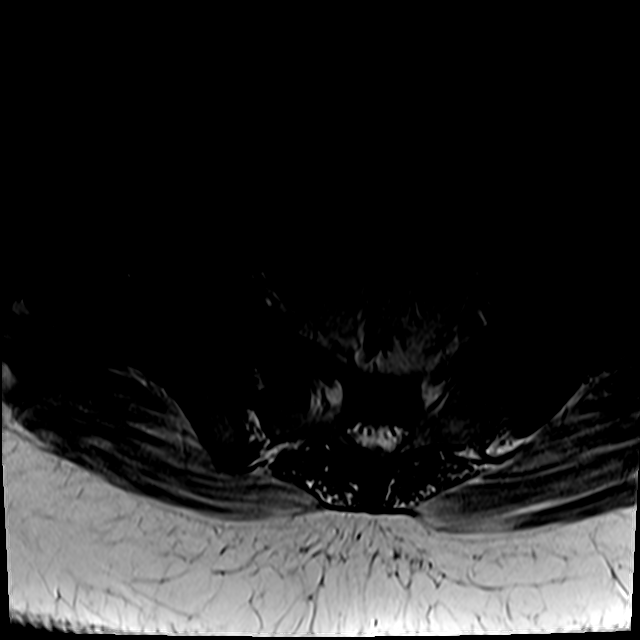
[im 6/34]
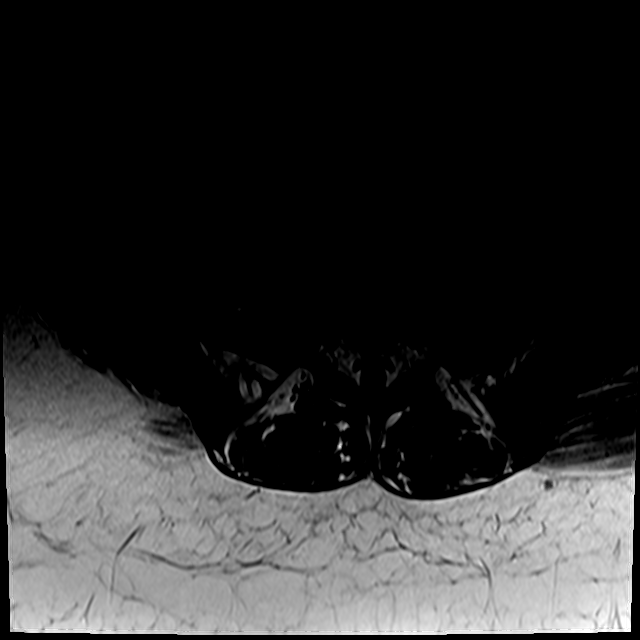
[im 11/34]
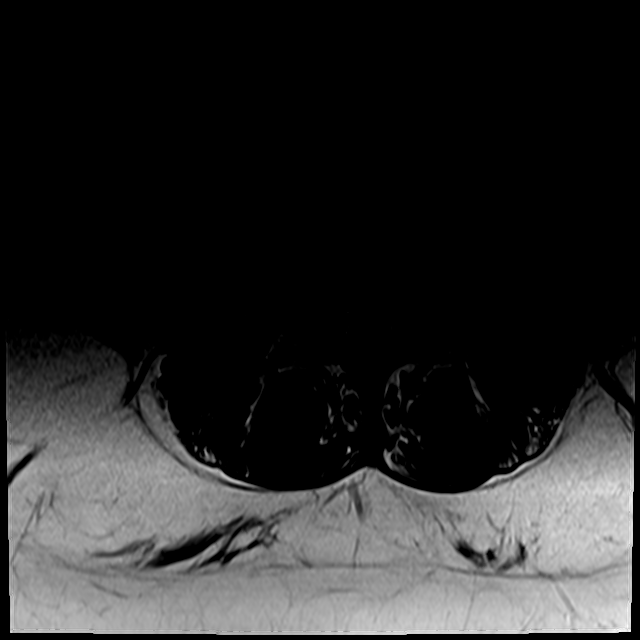
[im 16/34]
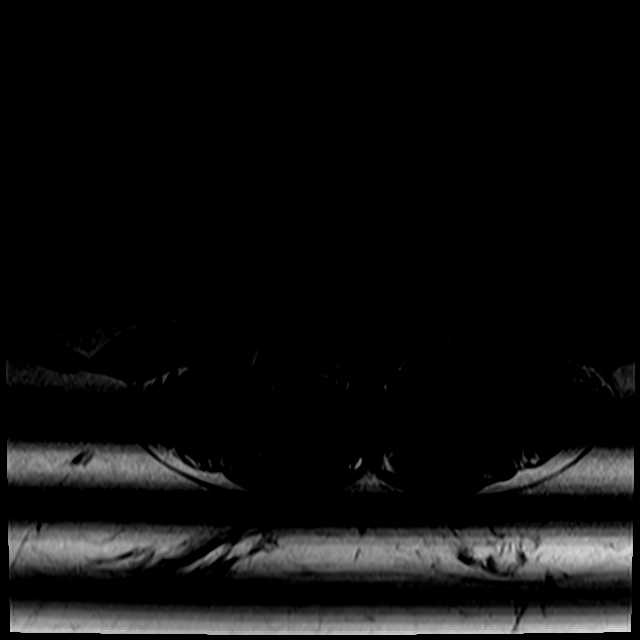
[im 18/34]
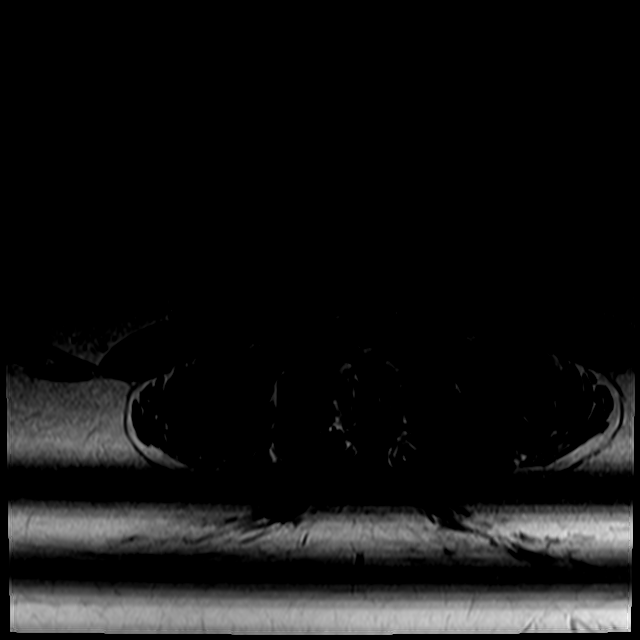
[im 23/34]
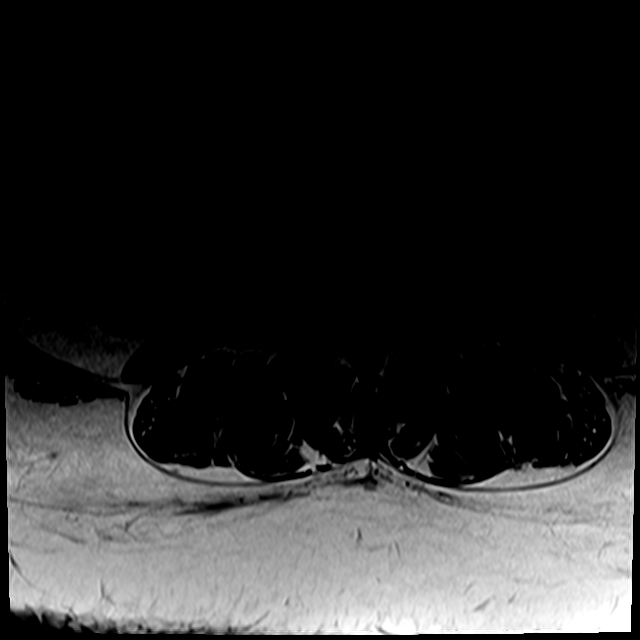
[im 28/34]
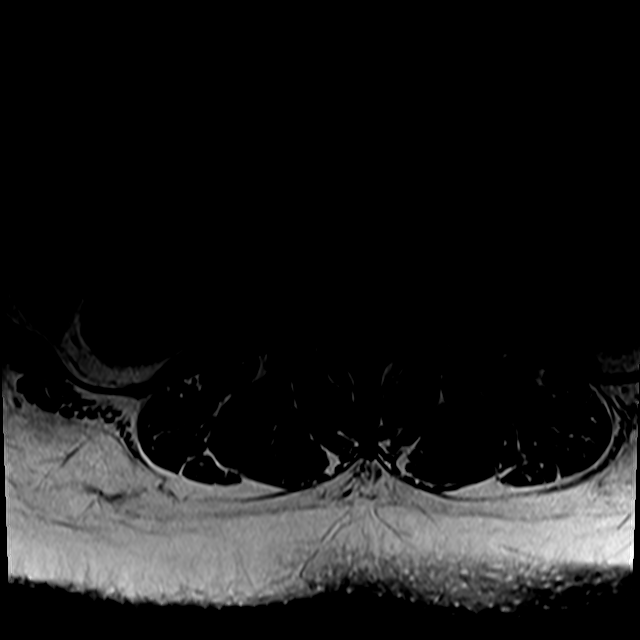

[Series 10: T1 · sagittal · 4.0mm · 0.88mm/px · 3 of 16 slices shown (2 of 2)]
[im 3/16]
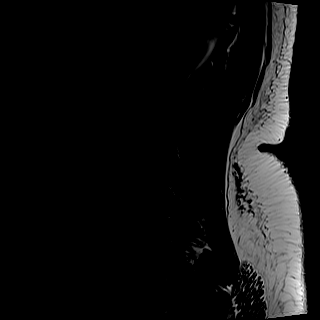
[im 8/16]
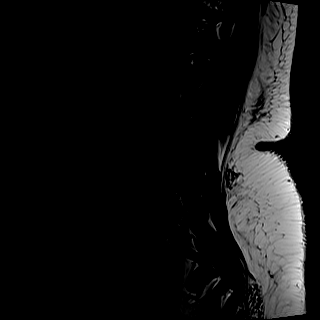
[im 13/16]
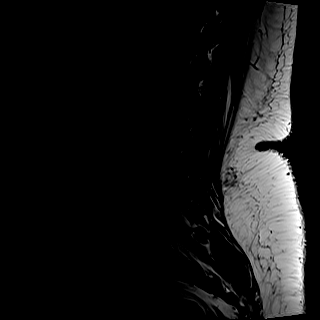

[Series 11: T2 · axial · 4.0mm · 0.57mm/px · z∈[-5,+218]mm · 8 of 34 slices shown (2 of 2)]
[im 1/34]
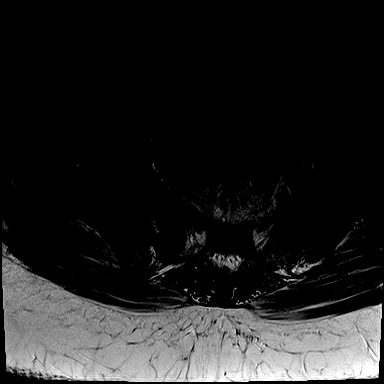
[im 6/34]
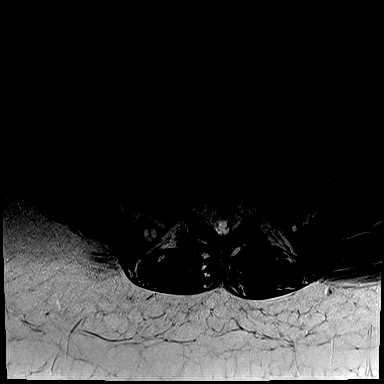
[im 11/34]
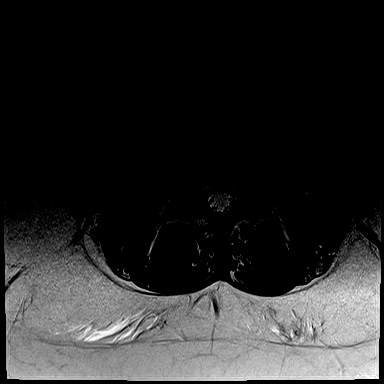
[im 16/34]
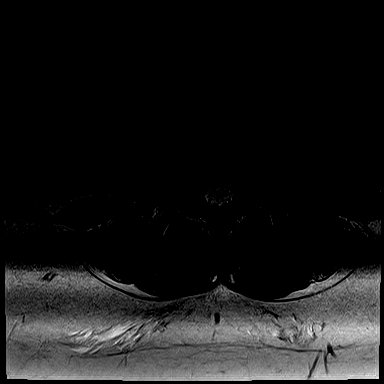
[im 18/34]
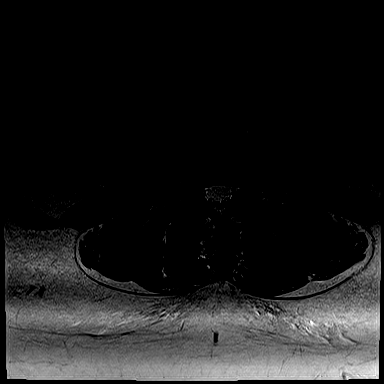
[im 23/34]
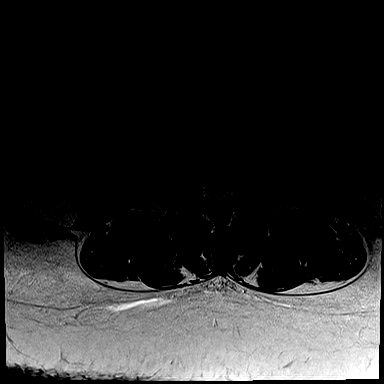
[im 28/34]
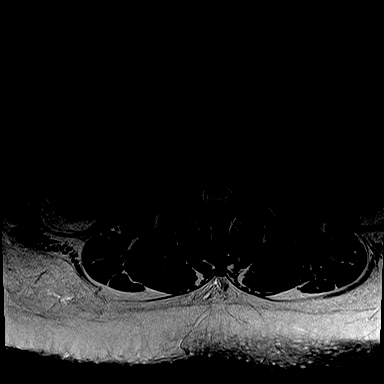
[im 34/34]
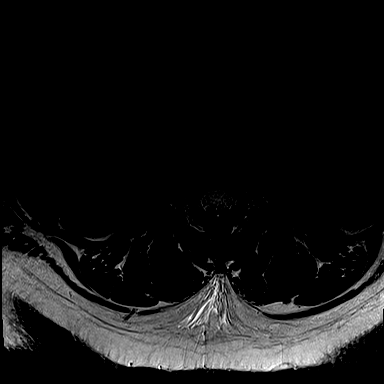

[24 of 48 positions shown; findings below may reference images not displayed]

FINDINGS: Segmentation: Assumed standard. The last well-formed disc space is
designated L5-S1 for the purposes of this report.

Alignment:  Physiologic.

Vertebrae:  No fracture, evidence of discitis, or bone lesion.

Conus medullaris and cauda equina: Conus extends to the L1 level.
Conus and cauda equina appear normal.

Paraspinal and other soft tissues: Negative.

Disc levels:

Disc heights and hydration are preserved. No significant disc bulge
or herniation. No spinal canal or neuroforaminal stenosis.
IMPRESSION: 1. Normal MRI of the lumbar spine.

## 2020-12-31 IMAGING — MR MR HEAD WO/W CM
14 of 17 series · 27 of 48 positions shown · IV contrast (10 GV)
Comparison: None.

CLINICAL DATA: Multiple sclerosis, left leg numbness starting a
week ago, right leg numbness starting today

EXAM:
MRI HEAD WITHOUT AND WITH CONTRAST
TECHNIQUE: Multiplanar, multiecho pulse sequences of the brain and surrounding
structures were obtained without and with intravenous contrast.
CONTRAST:  10mL GADAVIST GADOBUTROL 1 MMOL/ML IV SOLN

[Series 5: DWI · coronal · 4.0mm · 0.94mm/px · 1 of 70 slices shown (1 of 2)]
[im 1/70]
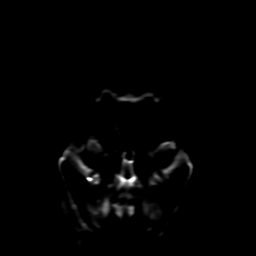

[Series 6: FLAIR · sagittal · 5.0mm · 0.47mm/px · 1 of 25 slices shown (1 of 3)]
[im 1/25]
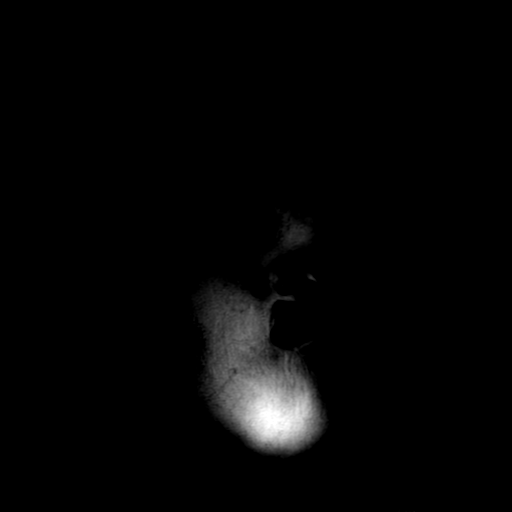

[Series 7: T2 · axial · 5.0mm · 0.47mm/px · 1 of 25 slices shown]
[im 1/25]
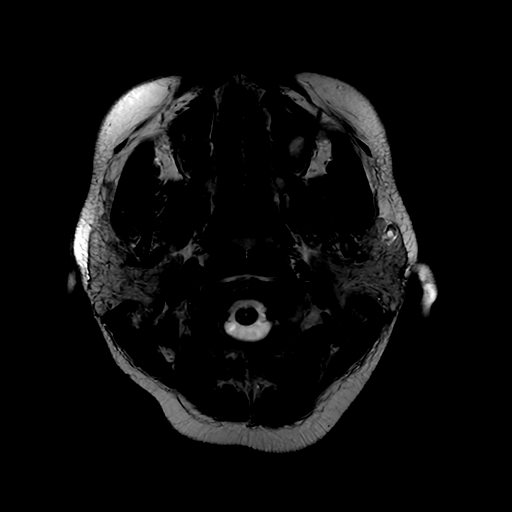

[Series 8: DWI · axial · 3.0mm · 0.94mm/px · z∈[-69,+80]mm · 3 of 99 slices shown (2 of 2)]
[im 1/99]
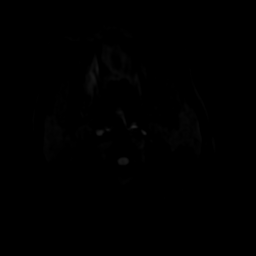
[im 50/99]
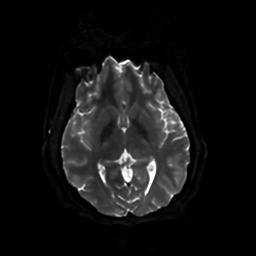
[im 99/99]
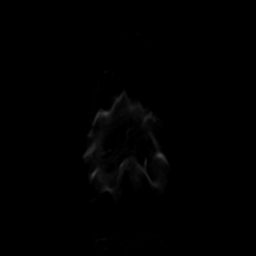

[Series 9: FLAIR · axial · 4.0mm · 0.45mm/px · 1 of 35 slices shown (2 of 3)]
[im 1/35]
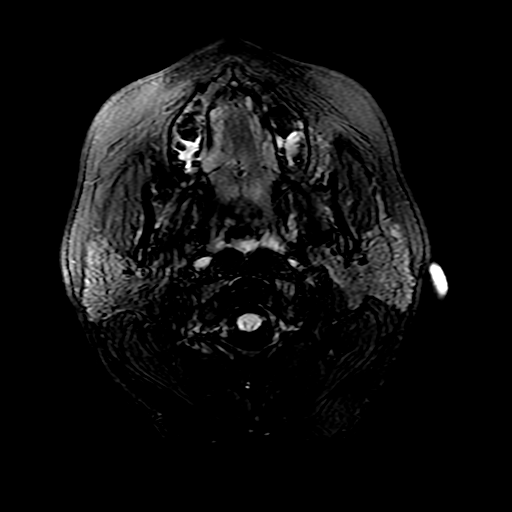

[Series 10: (person_name) · axial · 3.0mm · 0.47mm/px · z∈[-66,+82]mm · 3 of 100 slices shown]
[im 1/100]
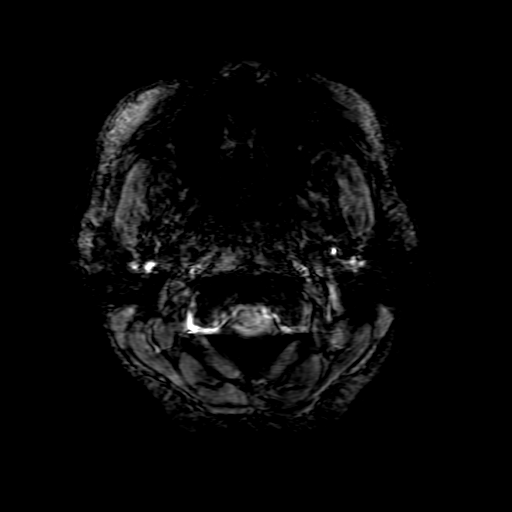
[im 50/100]
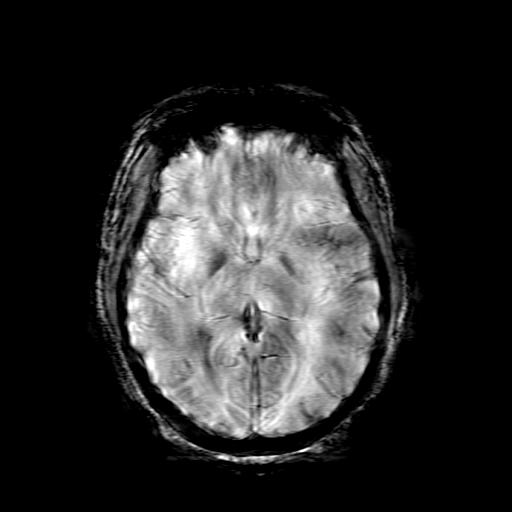
[im 100/100]
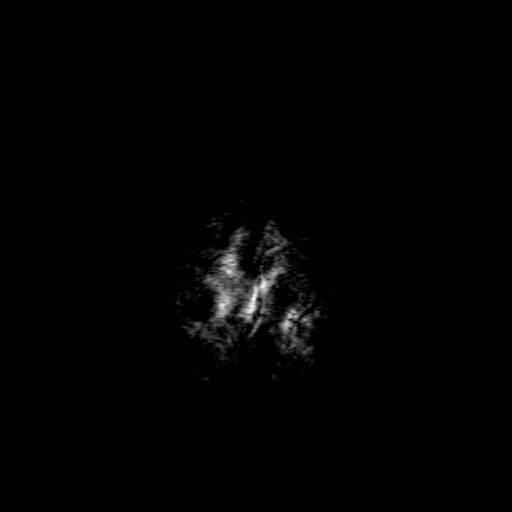

[Series 11: ax 3(person_name) pre · axial · non-contrast · 3.0mm · 0.94mm/px · z∈[-65,+81]mm · 2 of 50 slices shown]
[im 1/50]
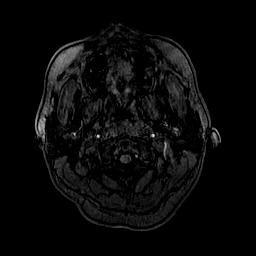
[im 50/50]
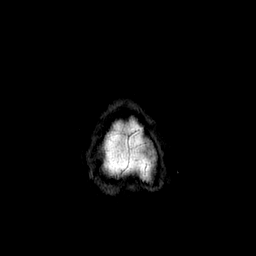

[Series 12: FLAIR · sagittal · 1.6mm · 0.49mm/px · 7 of 196 slices shown (3 of 3)]
[im 1/196]
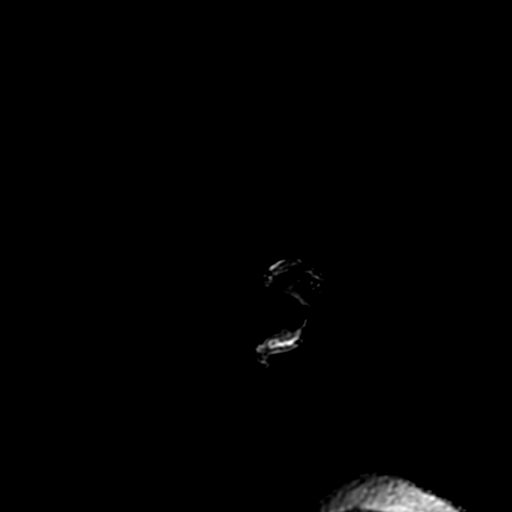
[im 33/196]
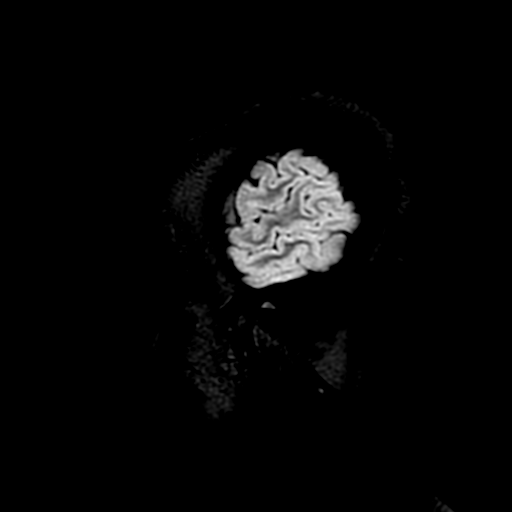
[im 66/196]
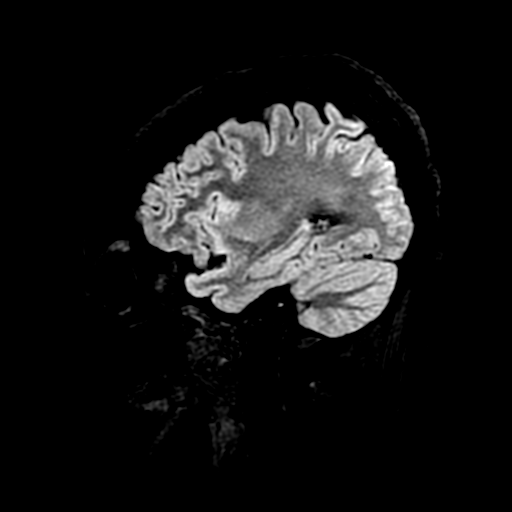
[im 98/196]
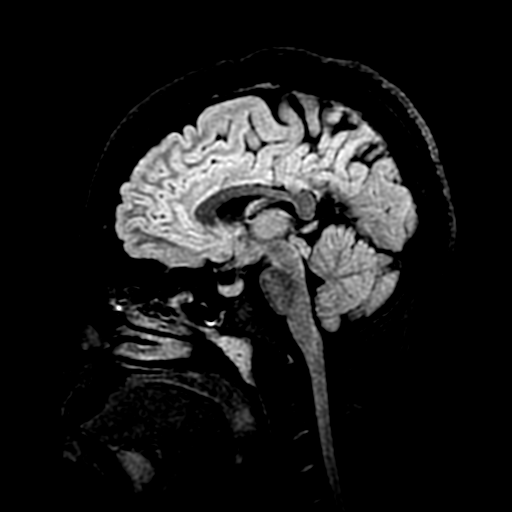
[im 131/196]
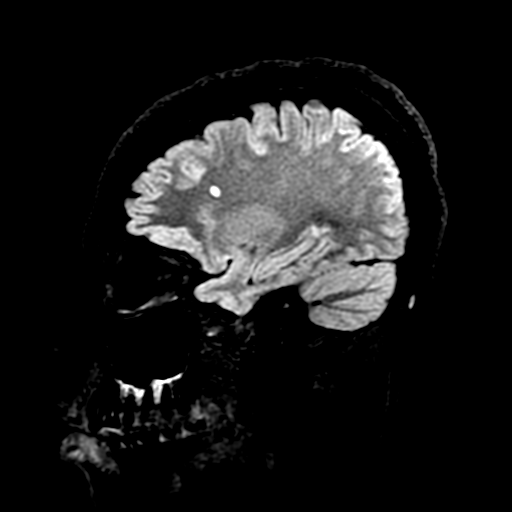
[im 163/196]
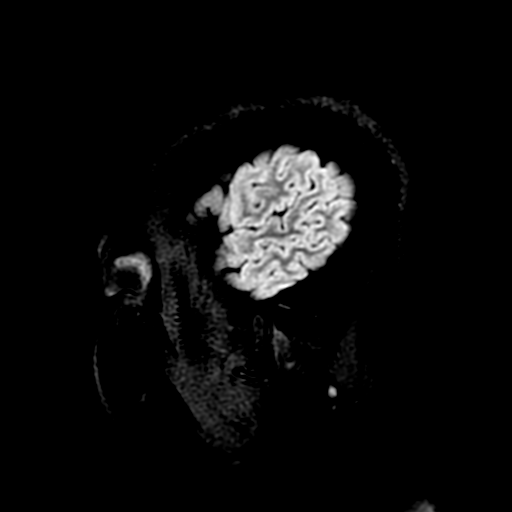
[im 196/196]
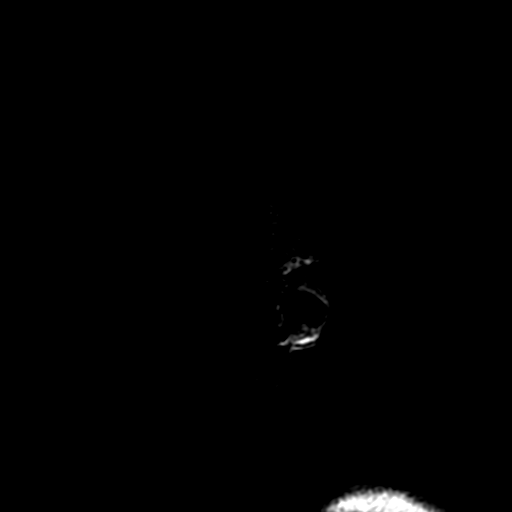

[Series 30: T2 post-contrast · coronal · 5.0mm · 0.39mm/px · 1 of 29 slices shown]
[im 1/29]
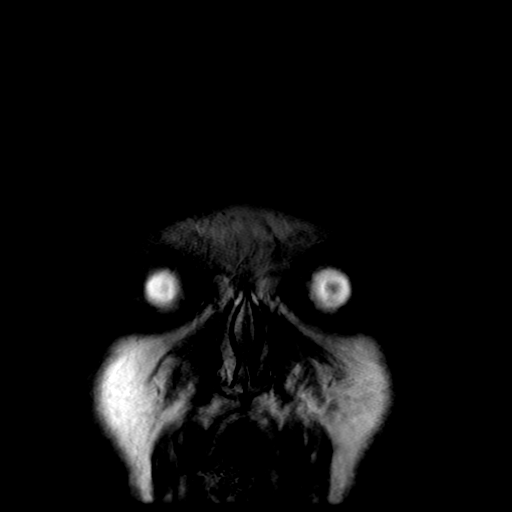

[Series 31: T1 · axial · 3.0mm · 0.94mm/px · z∈[-65,+81]mm · 2 of 50 slices shown (1 of 2)]
[im 1/50]
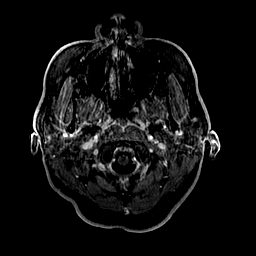
[im 50/50]
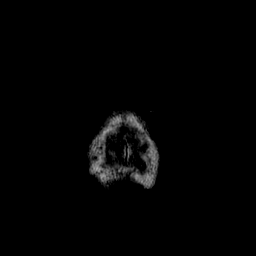

[Series 32: T1 · coronal · 5.0mm · 0.39mm/px · 1 of 29 slices shown (2 of 2)]
[im 1/29]
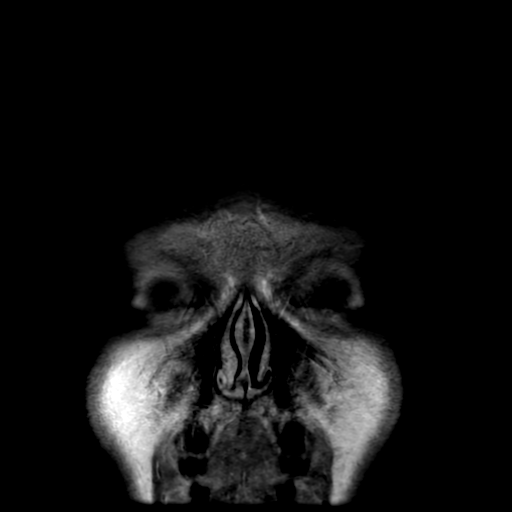

[Series 33: FLAIR post-contrast · sagittal · 5.0mm · 0.47mm/px · 1 of 25 slices shown]
[im 1/25]
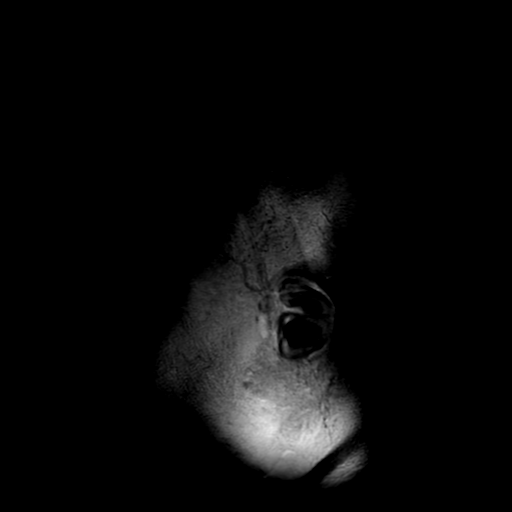

[Series 550: ADC · coronal · 4.0mm · 0.94mm/px · 1 of 35 slices shown (1 of 2)]
[im 1/35]
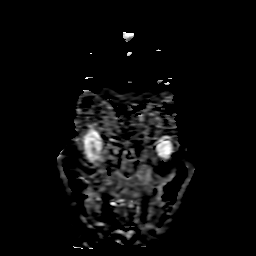

[Series 850: ADC · axial · 3.0mm · 0.94mm/px · z∈[-69,+83]mm · 2 of 52 slices shown (2 of 2)]
[im 1/52]
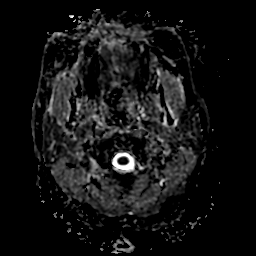
[im 52/52]
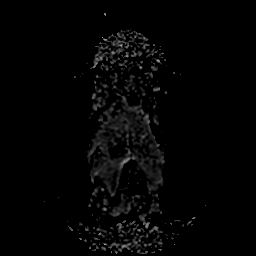

[27 of 48 positions shown; findings below may reference images not displayed]

FINDINGS: Brain: There is a single focus of FLAIR signal abnormality in the
right corona radiata. There is no associated enhancement or
diffusion restriction.

No other parenchymal signal abnormality is identified. There are
prominent perivascular spaces in the basal ganglia. The ventricles
are normal in size. There is no mass lesion. There is no midline
shift.

Vascular: Normal flow voids.

Skull and upper cervical spine: Marrow signal is normal. The
cervical spine is assessed on the separately dictated cervical spine
MRI.

Sinuses/Orbits: There is mild mucosal thickening in the right
sphenoid sinus. The globes are unremarkable. The optic nerves are
grossly unremarkable on these nondedicated views.

Other: None.
IMPRESSION: 1. Single focus of FLAIR signal abnormality in the right corona
radiata is nonspecific but could be seen with multiple sclerosis.
There is no enhancement to suggest active demyelination.
2. No other lesions identified.

## 2020-12-31 MED ORDER — ACETAMINOPHEN 500 MG PO TABS
1000.0000 mg | ORAL_TABLET | Freq: Once | ORAL | Status: AC
  Administered 2020-12-31: 1000 mg via ORAL
  Filled 2020-12-31: qty 2

## 2020-12-31 MED ORDER — ACETAMINOPHEN 325 MG PO TABS
650.0000 mg | ORAL_TABLET | Freq: Four times a day (QID) | ORAL | Status: DC | PRN
  Administered 2020-12-31 – 2021-01-01 (×2): 650 mg via ORAL
  Filled 2020-12-31 (×2): qty 2

## 2020-12-31 MED ORDER — GADOBUTROL 1 MMOL/ML IV SOLN
10.0000 mL | Freq: Once | INTRAVENOUS | Status: AC | PRN
  Administered 2020-12-31: 10 mL via INTRAVENOUS

## 2020-12-31 MED ORDER — LORAZEPAM 2 MG/ML IJ SOLN: 1.0000 mg | Freq: Once | INTRAMUSCULAR | Status: DC | PRN

## 2020-12-31 MED ORDER — ENOXAPARIN SODIUM 60 MG/0.6ML IJ SOSY
0.5000 mg/kg | PREFILLED_SYRINGE | INTRAMUSCULAR | Status: DC
  Administered 2020-12-31 – 2021-01-01 (×2): 55 mg via SUBCUTANEOUS
  Filled 2020-12-31: qty 0.6
  Filled 2020-12-31: qty 0.55

## 2020-12-31 MED ORDER — HYDROMORPHONE HCL 1 MG/ML IJ SOLN
1.0000 mg | Freq: Once | INTRAMUSCULAR | Status: AC
Start: 2020-12-31 — End: 2020-12-31
  Administered 2020-12-31: 1 mg via INTRAVENOUS
  Filled 2020-12-31: qty 1

## 2020-12-31 MED ORDER — ACETAMINOPHEN 650 MG RE SUPP: 650.0000 mg | Freq: Four times a day (QID) | RECTAL | Status: DC | PRN

## 2020-12-31 NOTE — ED Triage Notes (Signed)
Per [facility], pt from work, reports bilateral leg numbness and pain X1 week.    190/92 113pulse 100% RA 127 CBG RR 18   No neuro deficits, pain is in her calves and knees when she stands.

## 2020-12-31 NOTE — ED Notes (Signed)
Assit patient with admitting doctor  ambulate from room to bathroom from bathroom to side of bed

## 2020-12-31 NOTE — Plan of Care (Signed)
Subjective:  Patient reports her pain is slightly improved after nothing and that she did well with MRI with Dilaudid on board although it did make her feel "funny"  Exam: She continues to have patchy sensation change from he also endorses reduced sensation on the left side of her face to temperature and a length dependent loss of temperature sensation in her upper extremities but not her lower extremities.  Reflexes continue to be preserved although she does need encouragement to relax the left thigh in particular.  She has had no progression of her symptoms in the few hours since her last neurological examination by Dr. Derry Lory.  She describes the pain as coming in waves of sensation from her foot up through her leg.  She notes he uses marijuana for her mental health to manage anxiety/depression/PTSD symptoms, and notes that she has been using crystal healing therapy for the last 1 month to help manage her symptoms as well.  Preliminarily labs are reassuring  Lab Results  Component Value Date   VITAMINB12 402 12/31/2020    Lab Results  Component Value Date   CKTOTAL 91 12/31/2020   Lab Results  Component Value Date   TSH 2.033 12/31/2020    No results found for: ANA  Preliminary result of lower extremity duplex is negative for DVT though not yet finalized  MRI brain, cervical spine and thoracic spine personally reviewed, agree with radiology as reported below.  MRI brain 1. Single focus of FLAIR signal abnormality in the right corona radiata is nonspecific but could be seen with multiple sclerosis. There is no enhancement to suggest active demyelination. 2. No other lesions identified.  MRI C- and T-spine 1. Negative MRI's of the cervical and thoracic spine. No evidence of demyelinating process or abnormal intradural enhancement. 2. No significant spondylosis, spinal stenosis or nerve root encroachment. 3. Prominent adenoid tissue noted.  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  I do not feel that the brain imaging finding is acute, nor does it explain her symptoms, favored to be an incidental finding.  Spine imaging is reassuring but will obtain additional imaging as detailed below to rule out any acute process that needs further inpatient neurologic work-up  Recommendations: -Appreciate PT/OT evaluation -A1c for neuropathy workup -Will obtain MRI of her thighs to better evaluate for possible myositis as this can be present even in the setting of normal CK -Due to the potentially sciatic description of the pain sensation in her legs we will also obtain MRI lumbar spine -Discussed with patient that if the studies are also reassuring remainder of her work-up may be completed on an outpatient basis if she is not rapidly declining and PT/OT agree that she is appropriate for outpatient therapy

## 2020-12-31 NOTE — ED Provider Notes (Signed)
[facility] EMERGENCY DEPARTMENT Provider Note   CSN: [id number] Arrival date & time: 12/30/20  2359     History Chief Complaint  Patient presents with   Leg Pain    Anita Mcbride is a 20 y.o. female.  HPI 20 year old female with no sniffing medical his presents to the ER with complaints of right-sided numbness and pain in her lower extremity which now has progressed to bilateral.  Patient states that this started about several days ago, initially started with right-sided numbness and pain with standing, but has now spread to the left.  She has significant pain with standing and walking.  She denies any back pain, loss of bowel bladder control, no saddle anesthesia.  She denies any fevers or chills or history of IV drug use.  She does endorse having a URI about a week ago which has slowly gotten better.  She denies any history of DVT, no recent travel.  She has never had this happen in the past, has not had no falls or recent back injuries.  No history of back pain.    Past Medical History:  Diagnosis Date   Seasonal allergies     There are no problems to display for this patient.   Past Surgical History:  Procedure Laterality Date   tubes in ears       OB History   No obstetric history on file.     No family history on file.  Social History   Tobacco Use   Smoking status: Never   Smokeless tobacco: Never  Vaping Use   Vaping Use: Never used  Substance Use Topics   Alcohol use: Not Currently   Drug use: Not Currently    Home Medications Prior to Admission medications   Medication Sig Start Date End Date Taking? Authorizing Provider  famotidine (PEPCID) 20 MG tablet Take 1 tablet (20 mg total) by mouth 2 (two) times daily. Patient not taking: No sig reported 06/02/16   Lowanda Foster, NP    Allergies    Patient has no known allergies.  Review of Systems   Review of Systems Ten systems reviewed and are negative for acute change, except as  noted in the HPI.   Physical Exam Updated Vital Signs BP 121/74   Pulse 75   Temp 98.6 F (37 C) (Oral)   Resp (!) 22   Ht 5\' 7"  (1.702 m)   Wt 108.9 kg   SpO2 100%   BMI 37.59 kg/m   Physical Exam Vitals and nursing note reviewed.  Constitutional:      General: She is not in acute distress.    Appearance: She is well-developed. She is not ill-appearing or diaphoretic.  HENT:     Head: Normocephalic and atraumatic.  Eyes:     Conjunctiva/sclera: Conjunctivae normal.  Cardiovascular:     Rate and Rhythm: Normal rate and regular rhythm.     Pulses: Normal pulses.     Heart sounds: No murmur heard. Pulmonary:     Effort: Pulmonary effort is normal. No respiratory distress.     Breath sounds: Normal breath sounds.  Abdominal:     Palpations: Abdomen is soft.     Tenderness: There is no abdominal tenderness.  Musculoskeletal:        General: Tenderness present. No swelling. Normal range of motion.     Cervical back: Neck supple.     Comments: No crepitus no significant lower extremity edema, no warmth, compartments are soft bilaterally. 2+  DP pulses bilaterally, moving all 5 digits in both extremities    Skin:    General: Skin is warm and dry.     Findings: No erythema.  Neurological:     Mental Status: She is alert and oriented to person, place, and time.     Sensory: Sensory deficit present.     Motor: Weakness present.     Comments: Reported decreased sensation to bilateral lower extremities, 2+ patellar reflexes and Gillies reflex.  No midline tenderness to the C, T, L-spine.  4/5 strength in lower extremities bilaterally.  5/5 strength in the upper extremities.  Cranial nerves II through XII intact.  Psychiatric:        Mood and Affect: Mood normal.        Behavior: Behavior normal.    ED Results / Procedures / Treatments   Labs (all labs ordered are listed, but only abnormal results are displayed) Labs Reviewed  CBC - Abnormal; Notable for the following  components:      Result Value   Hemoglobin 11.0 (*)    HCT 35.9 (*)    MCV 79.6 (*)    MCH 24.4 (*)    All other components within normal limits  BASIC METABOLIC PANEL - Abnormal; Notable for the following components:   Sodium 134 (*)    All other components within normal limits  RESP PANEL BY RT-PCR (FLU A&B, COVID) ARPGX2  CK  FOLATE  TSH  VITAMIN B12  METHYLMALONIC ACID, SERUM  ANTINUCLEAR ANTIBODIES, IFA  ANGIOTENSIN CONVERTING ENZYME  VITAMIN B6  PROTEIN ELECTROPHORESIS, SERUM  ALDOLASE  I-STAT BETA HCG BLOOD, ED ([facility], [facility], [facility] ONLY)    EKG None  Radiology No results found.  Procedures Procedures   Medications Ordered in ED Medications  HYDROmorphone (DILAUDID) injection 1 mg (has no administration in time range)  acetaminophen (TYLENOL) tablet 1,000 mg (1,000 mg Oral Given 12/31/20 0122)    ED Course  I have reviewed the triage vital signs and the nursing notes.  Pertinent labs & imaging results that were available during my care of the patient were reviewed by me and considered in my medical decision making (see chart for details).    MDM Rules/Calculators/A&P                           20 year old female presenting with bilateral lower extremity pain and numbness. Patient is afebrile on arrival, overall vitals reassuring.  Physical exam with 2+ patellar and Achilles reflexes, subjective numbness to bilateral extremities, no midline tenderness to the C, T, L-spine, limited range of motion of leg secondary to pain.  She does have 4/5 strength in bilateral lower extremities. 2+ DP pulses, no significant swelling noted to lower extremities bilaterally.  Her lab work overall is reassuring, she has a hemoglobin of 11, though no signs of an acute bleed.  Mild hyponatremia of 134.  I discussed the neurologic findings with Dr. Derry Lory with neurology.  He also saw and evaluated the patient, DDx includes MS versus GBS versus DVT versus vitamin deficiency.  He has placed  orders for an MRI of the brain and cervical spine, DVT orders placed.  CK, folate, B12, ANA, methylmalonic acid and angiotensin-converting enzyme ordered by Dr. Derry Lory. After discussion with him, due to the patient's significant pain on ambulation, I do think that she would benefit from further admission for further work-up and pain management/PT/OT.  Consulted hospitalist team for admission.  Signed out to [employer]  PA-C who will accept hospital call and admit the patient.   This was a shared visit with my supervising physician Dr. Madilyn Hook who independently saw and evaluated the patient & provided guidance in evaluation/management/disposition ,in agreement with care    Final Clinical Impression(s) / ED Diagnoses Final diagnoses:  Bilateral leg numbness    Rx / DC Orders ED Discharge Orders     None        Leone Brand 12/31/20 0717    Tilden Fossa, MD [birth year] [id number]

## 2020-12-31 NOTE — Progress Notes (Signed)
VASCULAR LAB    Bilateral lower extremity venous duplex has been performed.  See CV proc for preliminary results.   Linell Meldrum, RVT 12/31/2020, 11:20 AM

## 2020-12-31 NOTE — ED Notes (Signed)
Pt still in MRI. Will continue to monitor for pt return.

## 2020-12-31 NOTE — ED Notes (Signed)
Pt transported to MRI 

## 2020-12-31 NOTE — H&P (Addendum)
Date: 12/31/2020               Patient Name:  Anita Mcbride MRN: [id number]  DOB: February 11, 2001 Age / Sex: 20 y.o., female   PCP: Pcp, No         Medical Service: Internal Medicine Teaching Service         Attending Physician: Dr. Mayford Knife, Dorene Ar, MD    First Contact: Dr. Welton Flakes Pager: [phone]  Second Contact: Dr. Huel Cote Pager: [phone]       After Hours (After 5p/  First Contact Pager: [phone]  weekends / holidays): Second Contact Pager: [phone]   Chief Complaint: Leg pain and weakness  History of Present Illness:   [name] is a 20 y/o F with a PMH of seasonal allergies and HTN who presents to the [facility] with bilateral leg weakness and pain. Patient states that her initial symptoms started approximately a week ago, and only affected the left lower extremity. She states that her pain is felt in her muscles on the left side. Initially, her symptoms started around the knee around the knee progressed to up to her left thigh.  Additionally, she endorses new pain and weakness that started yesterday in the right lower extremity. This pain originates at the right knee and goes below to her feet.  Her bilateral leg pain pain is described as cramping in character.  Relaxing her muscles appears to aggravate the pain.  She has not tried medications, but has not been able to find alleviating factors.  Patient currently denies symptoms of fevers, headaches, vision changes, dysarthria, aphasia, nausea, vomiting, chest pain, abdominal pain, diarrhea/constipation, fecal incontinence, urinary incontinence/retention, or saddle paresthesias.  Patient does endorse a history of URI on 12/28/2020 to 12/29/2020, with symptoms of cough, nasal congestion, and increased sputum production, which she had to take off work for on 1 October.   Meds:  Pepcid 20 mg twice daily No outpatient medications have been marked as taking for the 12/31/20 encounter [facility] Encounter).      Allergies: Allergies as of 12/30/2020   (No Known Allergies)   Past Medical History:  Diagnosis Date   Seasonal allergies     Family History:  DM: Not known HTN: Maternal Grandmother Cancer: Not known Autoimmune: none  Social History:  Work: Electrical engineer Housing: Lives in an apartment with a room mate.  Tobacco: None ETOH: None Drugs: Marijuana Sexual Hx: Sexually active with two female partners, intermittent condom use, not on OCPs/IUD Menses: Duration 7 days, heavy flow first 2 days, with regular flow the remaining five. Has had one episode of a missed period earlier In either January or February of this year.   Review of Systems: A complete ROS was negative except as per HPI.   Physical Exam: Blood pressure 128/66, pulse 79, temperature 98.6 F (37 C), temperature source Oral, resp. rate 19, height 5\' 7"  (1.702 m), weight 108.9 kg, SpO2 99 %. Physical Exam Constitutional:      General: She is not in acute distress.    Appearance: She is obese. She is not ill-appearing, toxic-appearing or diaphoretic.  HENT:     Head: Normocephalic and atraumatic.     Mouth/Throat:     Mouth: Mucous membranes are moist.     Pharynx: Oropharynx is clear. No oropharyngeal exudate or posterior oropharyngeal erythema.  Eyes:     General: No scleral icterus.       Right eye: No discharge.  Left eye: No discharge.     Extraocular Movements: Extraocular movements intact.     Conjunctiva/sclera: Conjunctivae normal.     Pupils: Pupils are equal, round, and reactive to light.  Cardiovascular:     Rate and Rhythm: Normal rate and regular rhythm.     Pulses: Normal pulses.     Heart sounds: Normal heart sounds.  Pulmonary:     Effort: Pulmonary effort is normal. No respiratory distress.     Breath sounds: Normal breath sounds. No wheezing, rhonchi or rales.  Chest:     Chest wall: No tenderness.  Musculoskeletal:     Right lower leg: No edema.     Left lower leg: No  edema.     Comments: 5/5 strength in the upper extremities bilaterally 4/5 strength in the lower extremities bilaterally  Neurological:     Mental Status: She is alert and oriented to person, place, and time.     Comments: Cranial nerves II through XII intact bilaterally Subjective paresthesias on the left lateral dorsal surface of the foot and anterior thigh.  Subjective paresthesias additionally located only right anterior shin and thigh.  Psychiatric:        Mood and Affect: Mood normal.        Behavior: Behavior normal.      MRI:  MR BRAIN W WO CONTRAST  Result Date: 12/31/2020 CLINICAL DATA:  Multiple sclerosis, left leg numbness starting a week ago, right leg numbness starting today EXAM: MRI HEAD WITHOUT AND WITH CONTRAST TECHNIQUE: Multiplanar, multiecho pulse sequences of the brain and surrounding structures were obtained without and with intravenous contrast. CONTRAST:  10mL GADAVIST GADOBUTROL 1 MMOL/ML IV SOLN COMPARISON:  None. FINDINGS: Brain: There is a single focus of FLAIR signal abnormality in the right corona radiata. There is no associated enhancement or diffusion restriction. No other parenchymal signal abnormality is identified. There are prominent perivascular spaces in the basal ganglia. The ventricles are normal in size. There is no mass lesion. There is no midline shift. Vascular: Normal flow voids. Skull and upper cervical spine: Marrow signal is normal. The cervical spine is assessed on the separately dictated cervical spine MRI. Sinuses/Orbits: There is mild mucosal thickening in the right sphenoid sinus. The globes are unremarkable. The optic nerves are grossly unremarkable on these nondedicated views. Other: None. IMPRESSION: 1. Single focus of FLAIR signal abnormality in the right corona radiata is nonspecific but could be seen with multiple sclerosis. There is no enhancement to suggest active demyelination. 2. No other lesions identified. Electronically Signed   By:  Lesia Hausen M.D.   On: 12/31/2020 09:41   MR CERVICAL SPINE W WO CONTRAST  Result Date: 12/31/2020 CLINICAL DATA:  Spinal stenosis. Suspicion of demyelinating disease. Left leg numbness for 1 week with right-sided symptoms today. EXAM: MRI CERVICAL AND THORACIC SPINE WITHOUT AND WITH CONTRAST TECHNIQUE: Multiplanar and multiecho pulse sequences of the cervical spine, to include the craniocervical junction and cervicothoracic junction, and the thoracic spine, were obtained without and with intravenous contrast. CONTRAST:  10mL GADAVIST GADOBUTROL 1 MMOL/ML IV SOLN COMPARISON:  Chest radiographs 05/21/2016. FINDINGS: MRI CERVICAL SPINE FINDINGS Alignment: Physiologic. Vertebrae: No acute or suspicious osseous findings. No abnormal osseous enhancement. Cord: Normal in signal and caliber. No abnormal intradural enhancement. Posterior Fossa, vertebral arteries, paraspinal tissues: Intracranial findings are dictated separately. Prominent adenoid tissue noted. No significant paraspinal findings. Bilateral vertebral artery flow voids. Disc levels: No evidence of disc herniation, spinal stenosis or nerve root encroachment. The spinal canal  and neural foramina are widely patent. MRI THORACIC SPINE FINDINGS Alignment:  Physiologic. Vertebrae: No acute or suspicious osseous findings. No abnormal osseous enhancement. Cord: The thoracic cord appears normal in signal and caliber. The conus medullaris extends to the L1 level. No abnormal intradural enhancement seen. Paraspinal and other soft tissues: No significant paraspinal abnormalities. Mild dependent subcutaneous edema in the upper lumbar region. Disc levels: The thoracic discs appear well hydrated. No disc herniation, spinal stenosis or nerve root encroachment. The spinal canal and neural foramina appear widely patent. IMPRESSION: 1. Negative MRI's of the cervical and thoracic spine. No evidence of demyelinating process or abnormal intradural enhancement. 2. No  significant spondylosis, spinal stenosis or nerve root encroachment. 3. Prominent adenoid tissue noted. Electronically Signed   By: Carey Bullocks M.D.   On: 12/31/2020 09:38   MR THORACIC SPINE W WO CONTRAST  Result Date: 12/31/2020 CLINICAL DATA:  Spinal stenosis. Suspicion of demyelinating disease. Left leg numbness for 1 week with right-sided symptoms today. EXAM: MRI CERVICAL AND THORACIC SPINE WITHOUT AND WITH CONTRAST TECHNIQUE: Multiplanar and multiecho pulse sequences of the cervical spine, to include the craniocervical junction and cervicothoracic junction, and the thoracic spine, were obtained without and with intravenous contrast. CONTRAST:  9mL GADAVIST GADOBUTROL 1 MMOL/ML IV SOLN COMPARISON:  Chest radiographs 05/21/2016. FINDINGS: MRI CERVICAL SPINE FINDINGS Alignment: Physiologic. Vertebrae: No acute or suspicious osseous findings. No abnormal osseous enhancement. Cord: Normal in signal and caliber. No abnormal intradural enhancement. Posterior Fossa, vertebral arteries, paraspinal tissues: Intracranial findings are dictated separately. Prominent adenoid tissue noted. No significant paraspinal findings. Bilateral vertebral artery flow voids. Disc levels: No evidence of disc herniation, spinal stenosis or nerve root encroachment. The spinal canal and neural foramina are widely patent. MRI THORACIC SPINE FINDINGS Alignment:  Physiologic. Vertebrae: No acute or suspicious osseous findings. No abnormal osseous enhancement. Cord: The thoracic cord appears normal in signal and caliber. The conus medullaris extends to the L1 level. No abnormal intradural enhancement seen. Paraspinal and other soft tissues: No significant paraspinal abnormalities. Mild dependent subcutaneous edema in the upper lumbar region. Disc levels: The thoracic discs appear well hydrated. No disc herniation, spinal stenosis or nerve root encroachment. The spinal canal and neural foramina appear widely patent. IMPRESSION: 1.  Negative MRI's of the cervical and thoracic spine. No evidence of demyelinating process or abnormal intradural enhancement. 2. No significant spondylosis, spinal stenosis or nerve root encroachment. 3. Prominent adenoid tissue noted. Electronically Signed   By: Carey Bullocks M.D.   On: 12/31/2020 09:38   VAS [place] LOWER EXTREMITY VENOUS (DVT) (ONLY [facility] & [facility])  Result Date: 12/31/2020  Lower Venous DVT Study Patient Name:  Anita Mcbride  Date of Exam:   12/31/2020 Medical Rec #: [id number]        Accession #:    [id number] Date of Birth: 2000/09/25       Patient Gender: F Patient Age:   19 years Exam Location:  [facility] Procedure:      VAS [place] LOWER EXTREMITY VENOUS (DVT) Referring Phys: MARIA BELAYA --------------------------------------------------------------------------------  Indications: Pain, and weakness.  Limitations: Patient could not tolerate compression and body habitus. Comparison Study: No prior study on file Performing Technologist: Sherren Kerns RVS  Examination Guidelines: A complete evaluation includes B-mode imaging, spectral Doppler, color Doppler, and power Doppler as needed of all accessible portions of each vessel. Bilateral testing is considered an integral part of a complete examination. Limited examinations for reoccurring indications may be performed as noted. The reflux  portion of the exam is performed with the patient in reverse Trendelenburg.  +---------+---------------+---------+-----------+----------+--------------+ RIGHT    CompressibilityPhasicitySpontaneityPropertiesThrombus Aging +---------+---------------+---------+-----------+----------+--------------+ CFV      Full           Yes      Yes                                 +---------+---------------+---------+-----------+----------+--------------+ SFJ      Full                                                        +---------+---------------+---------+-----------+----------+--------------+ FV Prox   Full                                                        +---------+---------------+---------+-----------+----------+--------------+ FV Mid   Full                                                        +---------+---------------+---------+-----------+----------+--------------+ FV DistalFull                                                        +---------+---------------+---------+-----------+----------+--------------+ PFV      Full                                                        +---------+---------------+---------+-----------+----------+--------------+ POP      Full           Yes      Yes                                 +---------+---------------+---------+-----------+----------+--------------+ PTV      Full                                                        +---------+---------------+---------+-----------+----------+--------------+ PERO     Full                                                        +---------+---------------+---------+-----------+----------+--------------+   +---------+---------------+---------+-----------+----------+-------------------+ LEFT     CompressibilityPhasicitySpontaneityPropertiesThrombus Aging      +---------+---------------+---------+-----------+----------+-------------------+ CFV  Yes      Yes                  patent by color and                                                       Doppler             +---------+---------------+---------+-----------+----------+-------------------+ FV Prox                 Yes      Yes                  patent by color and                                                       Doppler             +---------+---------------+---------+-----------+----------+-------------------+ FV Mid                  Yes      Yes                  patent by color and                                                       Doppler              +---------+---------------+---------+-----------+----------+-------------------+ FV Distal               Yes      Yes                  patent by color and                                                       Doppler             +---------+---------------+---------+-----------+----------+-------------------+ PFV                     Yes      Yes                  patent by color and                                                       Doppler             +---------+---------------+---------+-----------+----------+-------------------+ POP                     Yes      Yes  patent by color and                                                       Doppler             +---------+---------------+---------+-----------+----------+-------------------+ PTV      Full                                                             +---------+---------------+---------+-----------+----------+-------------------+ PERO     Full                                                             +---------+---------------+---------+-----------+----------+-------------------+    Summary: RIGHT: - There is no evidence of deep vein thrombosis in the lower extremity. However, portions of this examination were limited- see technologist comments above.  LEFT: - There is no evidence of deep vein thrombosis in the lower extremity. However, portions of this examination were limited- see technologist comments above.  *See table(s) above for measurements and observations.    Preliminary       Assessment & Plan by Problem: Active Problems:   Leg pain, bilateral  Anita Mcbride is a 85 /o F with a PMH of HTN and seasonal allergies who presents with bilateral leg pain with weakness, and admitted for further workup.   Bilateral Leg Pain with Weakness: Patient presents with bilateral leg pain and weakness. There is subjective loss of sensation patchy distribution across lateral  lower extremities.  Possible etiologies to consider would be demyelinating disorders versus myopathy/myositis versus autoimmune versus vitamin deficiency versus  Endocrine vs DVT.  While patient does have pain in the bilateral legs, her legs appear symmetric, and there is tenderness to palpation on the anterior and posterior aspect of the limbs.  Denna Haggard' sign was negative, patient has not endorsed prolonged travel, prolonged resting greater than 3 days, which makes this an unlikely etiology.  Patient's TSH has since come back negative, ruling out thyroid disorder. Patient's folate and B12 have additionally come back negative from this workup,  but her B6 and MMA are pending. Will additionally rule out autoimmune disorders with broad workup. For myopathy, her initial CK came back negative, aldose is pending. Her symptoms may be in the setting of viral myopathy from her URI. Additionally, she is sexually active 2 M partners and intermittently uses condoms. HIV myopathy can be an initial presentation, and will assess for HIV. She did undergo MRI brain, cervical/thoracic spine which was significant for a single FLAIR signal in the R corona radiata, discussed MRI findings with neurology attending, and will continue to monitor symptoms. - Appreciate neurology recommendations - Follow-up HIV with reflex - Follow-up aldolase - Follow-up vitamin B6 - Follow-up MMA - Follow-up RF, ACE inhibitor, ANA  HTN:  Patient endorses a history of HTN, pressures have been stable in the ED, will continue to monitor.  - Continue monitor Vitals  Microcytic Anemia:  Patient presents with  Hgb of 11.0 and MCV of 79.9. Given history of heavy menses, her presentation may be secondary to iron deficiency. Will assess iron panel as she may need replacement while admitted. - Trend CBC - Obtain Iron studies  Dispo: Admit patient to Inpatient with expected length of stay greater than 2 midnights.  Signed: Dolan Amen,  MD 12/31/2020, 10:19 AM  Pager:  After 5pm on weekdays and 1pm on weekends: On Call pager: [phone]

## 2020-12-31 NOTE — Consult Note (Signed)
NEUROLOGY CONSULTATION NOTE   Date of service: December 31, 2020 Patient Name: Anita Mcbride MRN:  [id number] DOB:  06-Jan-2001 Reason for consult: "BL lower ext weakness and pain" Requesting Provider: Tilden Fossa, MD _ _ _   _ __   _ __ _ _  __ __   _ __   __ _  History of Present Illness  Anita Mcbride is a 20 y.o. female with PMH significant for seasonal allergies who presents with pain in BL legs.  Symptoms started as pain around her Left knees about a week ago. Feels like her muscles hurt like she had a workout. It went down from her knees and then up into her left hip. Again mostly its the inside of her legs in her muscles where she has pain. She reports that since yesterday, her right knee has started hurting and it is again her calf and goes down below.  No prior similar symptoms, no hx of autoimmune disorders. Had cough and had to clear her throat over the last couple of days with some sniffles. No fever, no chills, no UTI like symptoms, no nausea, vomiting, diarrhea, no cuts or wound on her skins. Has some healed self cuts on BL forearms but those are really old and she is not suicidal anymore.   Eats mostly canned food due to tight finances. Does not take a statin. Not on any medication at home.  Denies lhermitte's sign, no constipation, can sense when her urinary bladder is full and can hold it in for a decent amount of time and can completely empty her bladder without any issues, no genital numbness.  No hx of clots in her legs or in her lungs. No hx of clotting disorder.  Smokes marijuana but no alcohol use, no other recreational substances.   ROS   Constitutional Denies weight loss, fever and chills.   HEENT Denies changes in vision and hearing.   Respiratory Denies SOB and cough.   CV Denies palpitations and CP   GI Denies abdominal pain, nausea, vomiting and diarrhea.   GU Denies dysuria and urinary frequency.   MSK endorses myalgia and joint pain.   Skin  Denies rash and pruritus.  Neurological Denies headache and syncope.  Psychiatric Denies recent changes in mood. Denies anxiety and depression.   Past History   Past Medical History:  Diagnosis Date   Seasonal allergies    Past Surgical History:  Procedure Laterality Date   tubes in ears     No family history on file. Social History   Socioeconomic History   Marital status: Single    Spouse name: Not on file   Number of children: Not on file   Years of education: Not on file   Highest education level: Not on file  Occupational History   Not on file  Tobacco Use   Smoking status: Never   Smokeless tobacco: Never  Vaping Use   Vaping Use: Never used  Substance and Sexual Activity   Alcohol use: Not Currently   Drug use: Not Currently   Sexual activity: Not on file  Other Topics Concern   Not on file  Social History Narrative   Not on file   Social Determinants of Health   Financial Resource Strain: Not on file  Food Insecurity: Not on file  Transportation Needs: Not on file  Physical Activity: Not on file  Stress: Not on file  Social Connections: Not on file   No Known Allergies  Medications  (Not in a hospital admission)    Vitals   Vitals:   12/31/20 0003 12/31/20 0008 12/31/20 0336 12/31/20 0500  BP: 118/75  (!) 143/76 121/74  Pulse: (!) 106  83 75  Resp: 16  20 (!) 22  Temp: 98.6 F (37 C)     TempSrc: Oral     SpO2: 98%  100% 100%  Weight:  108.9 kg    Height:  5\' 7"  (1.702 m)       Body mass index is 37.59 kg/m.  Physical Exam   General: Laying comfortably in bed; in no acute distress.  HENT: Normal oropharynx and mucosa. Normal external appearance of ears and nose.  Neck: Supple, no pain or tenderness  CV: No JVD. No peripheral edema.  Pulmonary: Symmetric Chest rise. Normal respiratory effort.  Abdomen: Soft to touch, non-tender.  Ext: No cyanosis, edema, or deformity  Skin: No rash. Normal palpation of skin.   Musculoskeletal:  Normal digits and nails by inspection. No clubbing.   Neurologic Examination  Mental status/Cognition: Alert, oriented to self, place, month and year, good attention.  Speech/language: Fluent, comprehension intact, object naming intact, repetition intact.  Cranial nerves:   CN II Pupils equal and reactive to light, no VF deficits    CN III,IV,VI EOM intact, no gaze preference or deviation, no nystagmus    CN V normal sensation in V1, V2, and V3 segments bilaterally    CN VII no asymmetry, no nasolabial fold flattening    CN VIII normal hearing to speech    CN IX & X normal palatal elevation, no uvular deviation    CN XI 5/5 head turn and 5/5 shoulder shrug bilaterally    CN XII midline tongue protrusion    Motor:  Muscle bulk: normal, tone normal, pronator drift none tremor none Mvmt Root Nerve  Muscle Right Left Comments  SA C5/6 Ax Deltoid 5 5   EF C5/6 Mc Biceps 5 5   EE C6/7/8 Rad Triceps 5 5   WF C6/7 Med FCR 5 5   WE C7/8 PIN ECU 5 5   F Ab C8/T1 U ADM/FDI 5 5   HF L1/2/3 Fem Illopsoas 4+ 4+   KE L2/3/4 Fem Quad 5 5   DF L4/5 D Peron Tib Ant 5 5   PF S1/2 Tibial Grc/Sol 5 5    Reflexes:  Right Left Comments  Pectoralis      Biceps (C5/6) 2 2   Brachioradialis (C5/6) 2 2    Triceps (C6/7) 2 2    Patellar (L3/4) 2 2    Achilles (S1) 2 2    Hoffman      Plantar mute mute   Jaw jerk    Sensation:  Light touch Decreased to touch in a patchy distribution in the medial aspect of her left thigh and inner aspect of R forearm   Pin prick No clear sensory level but decreased in patchy distribution in anterior aspect of abdoment below the xiphoid.   Temperature    Vibration   Proprioception    Coordination/Complex Motor:  - Finger to Nose intact BL - Heel to shin intact BL - Rapid alternating movement are normal - Gait: Stride length short. Arm swing poor, had to hold on to the CNA and to me. Base width narrow.  Labs   CBC:  Recent Labs  Lab 12/31/20 0500  WBC  9.7  HGB 11.0*  HCT 35.9*  MCV 79.6*  PLT 360  Basic Metabolic Panel:  Lab Results  Component Value Date   NA 134 (L) 12/31/2020   K 4.3 12/31/2020   CO2 24 12/31/2020   GLUCOSE 89 12/31/2020   BUN 11 12/31/2020   CREATININE 0.66 12/31/2020   CALCIUM 9.3 12/31/2020   GFRNONAA >60 12/31/2020   GFRAA NOT CALCULATED 12/10/2017   Lipid Panel: No results found for: LDLCALC HgbA1c: No results found for: HGBA1C Urine Drug Screen:     Component Value Date/Time   LABOPIA NONE DETECTED 12/10/2017 0849   COCAINSCRNUR NONE DETECTED 12/10/2017 0849   LABBENZ NONE DETECTED 12/10/2017 0849   AMPHETMU NONE DETECTED 12/10/2017 0849   THCU POSITIVE (A) 12/10/2017 0849   LABBARB NONE DETECTED 12/10/2017 0849    Alcohol Level No results found for: [facility]  MRI Brain, C, T spine w + w/o contrast: Pending.  Impression   Anita Mcbride is a 20 y.o. female with no significant PMH who essentially comes in with 1 week hx of entire LLE pain which she feels is deep in her muscles and 1 day hx of pain around her Rknee and going down. She also has patchy sensory loss with BL hip flexion weakness and no clear sensory level. Her exam to me seems most suggestive for a myopathy/myositis. Other differentials include potential demyelinating disorder, DVTs, myositis secondary to a systemic autoimmune phenomena, potential vitamin deficiency. Unlikely for GBS to be this asymmetric with preserved reflexes, no obvious symptoms of cord compression.  Recommendations  - MRI Brain, C, and T spine w + c/o contrast to evaluate for demyelinating process - Vit B12, B6, Folate, TSH, SPEP, MMA, serum CK, Aldolase, ANA with IFA, ACE, RF. - BL lower extremity dopplers to rule out DVT. Her left calf is tighter than her Right calf. - Recommend PT and OT. - further workup pending above  _______________________________________________________  Plan discussed with Anita Mcbride with the ED team.  Thank you for the  opportunity to take part in the care of this patient. If you have any further questions, please contact the neurology consultation attending.  Signed,  Erick Blinks [facility] Pager Number [phone] _ _ _   _ __   _ __ _ _  __ __   _ __   __ _

## 2020-12-31 NOTE — ED Provider Notes (Signed)
Care assumed from [place], [place] at this time. Please see providers full note for HPI/PE and work-up thus far. Briefly, this is a 20 year old female presents emergency department with right-sided numbness and pain in her lower extremity which is now progressed to bilateral pain. Physical Exam  BP 121/74   Pulse 75   Temp 98.6 F (37 C) (Oral)   Resp (!) 22   Ht 5\' 7"  (1.702 m)   Wt 108.9 kg   SpO2 100%   BMI 37.59 kg/m   Physical Exam  ED Course/Procedures  Per , PA-C and Dr. Vernia Buff with neurology, patient has abnormal neuro exam.  Dr. Derry Lory evaluated the patient at bedside.  Initial differential includes MS versus GBS versus DVT versus vitamin deficiency.  He placed orders for MRI of the brain, C-spine, T-spine.  Also placed numerous add-on labs for further work-up.  Procedures  MDM  Prior to handoff, neurology is already been consulted and seen the patient.  Per both Dr. Derry Lory and Derry Lory, PA-C think that due to patient's significant pain on ambulation, that she would benefit from further admission and work-up and pain management with PT/OT.  Took hospitalist admission consult at [phone] with Dr. [id number] who agrees to admit the patient at this time.      Sande Brothers, PA-C 12/31/20 03/02/21    [id number], MD 12/31/20 1451

## 2020-12-31 NOTE — Evaluation (Signed)
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  Has numbness on LLE initially now on BLE's mainly around the feet.  PMHx:  seasonal allergies, HTN, PTSD  Clinical Impression  Pt was seen for mobility with help to stand and walk initially, but more supervisory by end of session.  Has pain with weakness on knees, resulting in unsteadiness and cross steps with turns at times.   Pt is recommended to use rollator for safety and pain control, and will follow her acutely to get practice in with this device as well as stairs.  See for both next session, as pt is potentially going home tomorrow.     Recommendations for follow up therapy are one component of a multi-disciplinary discharge planning process, led by the attending physician.  Recommendations may be updated based on patient status, additional functional criteria and insurance authorization.  Follow Up Recommendations Home health PT;Supervision for mobility/OOB    Equipment Recommendations  Other (comment) (Rollator walker)    Recommendations for Other Services       Precautions / Restrictions Precautions Precautions: None Precaution Comments: anxiety Restrictions Weight Bearing Restrictions: No      Mobility  Bed Mobility Overal bed mobility: Needs Assistance Bed Mobility: Supine to Sit;Sit to Supine     Supine to sit: Min guard Sit to supine: Min guard   General bed mobility comments: min guard for safety but able to move LE's, help with lines    Transfers Overall transfer level: Needs assistance Equipment used: 1 person hand held assist Transfers: Sit to/from Stand Sit to Stand: Min guard         General transfer comment: min guard for safety and  lines  Ambulation/Gait Ambulation/Gait assistance: Min guard Gait Distance (Feet): 160 Feet (80 x 2) Assistive device: 1 person hand held assist Gait Pattern/deviations: Step-to pattern;Step-through pattern;Decreased stride length;Narrow base of support;Wide base of support (cross over steps in turns at times) Gait velocity: reduced Gait velocity interpretation: <1.31 ft/sec, indicative of household ambulator General Gait Details: pt was able to be assisted min guard but worsens B knee pain with mobility, and is unsteady on turning activity  Stairs            Wheelchair Mobility    Modified Rankin (Stroke Patients Only)       Balance Overall balance assessment: Needs assistance Sitting-balance support: Feet supported Sitting balance-Leahy Scale: Fair     Standing balance support: Single extremity supported Standing balance-Leahy Scale: Fair Standing balance comment: less than fair at times, catches herself                             Pertinent Vitals/Pain Pain Assessment: Faces Faces Pain Scale: Hurts little more Pain Location: L knee with effort to walk Pain Descriptors / Indicators: Aching;Guarding;Tightness Pain Intervention(s): Limited activity within patient's tolerance;Monitored during session;Premedicated before session;Repositioned    Home Living Family/patient expects to be discharged to:: Private residence Living Arrangements: Non-relatives/Friends Available Help at Discharge: Friend(s);Available PRN/intermittently Type of Home: Apartment Home Access: Stairs to enter Entrance Stairs-Rails: Lawyer of Steps: 36 Home Layout: One level Home Equipment: None Additional Comments: pt has been independent with all mobility  Prior Function Level of Independence: Needs assistance   Gait / Transfers Assistance Needed: mod I for all with no AD  ADL's / Homemaking Assistance Needed: I to do self care and do housework         Hand Dominance   Dominant Hand: Right    Extremity/Trunk Assessment   Upper Extremity Assessment Upper Extremity Assessment: Overall WFL for tasks assessed    Lower Extremity Assessment Lower Extremity Assessment: RLE deficits/detail;LLE deficits/detail RLE Deficits / Details: knee is weak RLE Coordination: decreased gross motor LLE Deficits / Details: L knee is weak LLE: Unable to fully assess due to pain LLE Coordination: decreased gross motor    Cervical / Trunk Assessment Cervical / Trunk Assessment: Normal  Communication   Communication: No difficulties  Cognition Arousal/Alertness: Awake/alert Behavior During Therapy: WFL for tasks assessed/performed Overall Cognitive Status: Within Functional Limits for tasks assessed                                 General Comments: pt is willing to walk with no AD but does note pain on L knee esp      General Comments General comments (skin integrity, edema, etc.): Pt is up to walk with help and reminders for safety of moving through obstacles and with closed doors.  Pt is interested in follow up therapy but is going to need help for recovery of strength and endurance for stairs for home    Exercises     Assessment/Plan    PT Assessment Patient needs continued PT services  PT Problem List Decreased strength;Decreased activity tolerance;Decreased balance;Decreased mobility;Decreased coordination;Decreased knowledge of use of DME;Decreased safety awareness;Pain       PT Treatment Interventions DME instruction;Gait training;Stair training;Functional mobility training;Therapeutic activities;Therapeutic exercise;Balance training;Patient/family education;Neuromuscular re-education    PT Goals (Current goals can be found in the Care Plan section)  Acute Rehab PT Goals Patient Stated Goal: to have less knee pain with gait PT Goal Formulation: With patient Time For Goal Achievement: 01/14/21 Potential to Achieve  Goals: Good    Frequency Min 3X/week   Barriers to discharge Inaccessible home environment home with 36 stairs to enter but roommate there currently    Co-evaluation               AM-PAC PT "6 Clicks" Mobility  Outcome Measure Help needed turning from your back to your side while in a flat bed without using bedrails?: A Little Help needed moving from lying on your back to sitting on the side of a flat bed without using bedrails?: A Little Help needed moving to and from a bed to a chair (including a wheelchair)?: A Little Help needed standing up from a chair using your arms (e.g., wheelchair or bedside chair)?: A Little Help needed to walk in hospital room?: A Little Help needed climbing 3-5 steps with a railing? : Total 6 Click Score: 16    End of Session Equipment Utilized During Treatment: Gait belt Activity Tolerance: Patient limited by fatigue;Patient limited by pain Patient left: in bed;with call bell/phone within reach Nurse Communication: Mobility status PT Visit Diagnosis: Unsteadiness on feet (R26.81);Muscle weakness (generalized) (M62.81);Pain Pain - Right/Left:  (both) Pain - part of body: Knee    Time: 6237-6283 PT Time Calculation (min) (ACUTE ONLY): 29 min   Charges:   PT Evaluation $PT Eval Moderate Complexity: 1 Mod PT Treatments $Gait Training: 8-22 mins  Ivar Drape 12/31/2020, 5:49 PM  Samul Dada, PT MS Acute Rehab Dept. Number: [facility] [phone] and [facility] [phone]

## 2021-01-01 DIAGNOSIS — I10 Essential (primary) hypertension: Secondary | ICD-10-CM

## 2021-01-01 DIAGNOSIS — F419 Anxiety disorder, unspecified: Secondary | ICD-10-CM | POA: Diagnosis not present

## 2021-01-01 LAB — RHEUMATOID FACTOR: Rheumatoid fact SerPl-aCnc: <10 IU/mL (ref <14.0)

## 2021-01-01 LAB — ALDOLASE: Aldolase: 8 U/L (ref 3.3–10.3)

## 2021-01-01 LAB — ANGIOTENSIN CONVERTING ENZYME: Angiotensin-Converting Enzyme: 39 U/L (ref 14–82)

## 2021-01-01 MED ORDER — POLYSACCHARIDE IRON COMPLEX 150 MG PO CAPS
150.0000 mg | ORAL_CAPSULE | Freq: Every day | ORAL | Status: DC
  Administered 2021-01-01: 150 mg via ORAL
  Filled 2021-01-01: qty 1

## 2021-01-01 MED ORDER — NAPROXEN 500 MG PO TABS: ORAL_TABLET | ORAL | 0 refills | Status: AC

## 2021-01-01 MED ORDER — DULOXETINE HCL 30 MG PO CPEP
30.0000 mg | ORAL_CAPSULE | Freq: Every day | ORAL | Status: DC
  Administered 2021-01-01: 30 mg via ORAL
  Filled 2021-01-01: qty 1

## 2021-01-01 MED ORDER — ACETAMINOPHEN 500 MG PO TABS: 1000.0000 mg | ORAL_TABLET | Freq: Three times a day (TID) | ORAL | 2 refills | Status: AC | PRN

## 2021-01-01 MED ORDER — NAPROXEN 250 MG PO TABS
500.0000 mg | ORAL_TABLET | Freq: Two times a day (BID) | ORAL | Status: DC
  Administered 2021-01-01 (×2): 500 mg via ORAL
  Filled 2021-01-01 (×2): qty 2

## 2021-01-01 MED ORDER — NAPROXEN 500 MG PO TABS: 500.0000 mg | ORAL_TABLET | Freq: Two times a day (BID) | ORAL | 0 refills | Status: DC

## 2021-01-01 MED ORDER — DULOXETINE HCL 30 MG PO CPEP: 30.0000 mg | ORAL_CAPSULE | Freq: Every day | ORAL | 0 refills | Status: DC

## 2021-01-01 NOTE — Evaluation (Signed)
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  Has numbness on LLE initially now on BLE's mainly around the feet.  PMHx:  seasonal allergies, HTN, PTSD   Clinical Impression   Pt presents with decreased balance/mobility and pain in BLEs that increases when ambulating. Pt is primarily independent with ADLs, requiring supervision for functional transfers. Pt with assistance available at home upon d/Mcbride. Pt would likely benefit from from 3n1 at d/Mcbride to prevent falls and increase safety with bathing/functional transfers. No further skilled OT services recommended upon d/Mcbride at this time. Will continue to follow acutely to ensure pt safety with occupational performance and practice with AE.      Recommendations for follow up therapy are one component of a multi-disciplinary discharge planning process, led by the attending physician.  Recommendations may be updated based on patient status, additional functional criteria and insurance authorization.   Follow Up Recommendations  No OT follow up    Equipment Recommendations  3 in 1 bedside commode    Recommendations for Other Services       Precautions / Restrictions Precautions Precautions: None Restrictions Weight Bearing Restrictions: No      Mobility Bed Mobility                    Transfers                      Balance Overall balance assessment: Needs assistance Sitting-balance support: Feet supported Sitting balance-Leahy Scale: Good     Standing balance support: Bilateral upper extremity supported Standing balance-Leahy Scale: Fair                             ADL either performed or assessed with clinical judgement   ADL  Overall ADL's : Needs assistance/impaired Eating/Feeding: Independent   Grooming: Independent   Upper Body Bathing: Modified independent;With adaptive equipment;Sitting   Lower Body Bathing: Supervison/ safety;With adaptive equipment   Upper Body Dressing : Independent   Lower Body Dressing: Independent   Toilet Transfer: Supervision/safety;Regular Toilet;Grab bars;RW   Toileting- Clothing Manipulation and Hygiene: Independent   Tub/ Shower Transfer: Supervision/safety;3 in Veterinary surgeon mobility during ADLs: Min guard;Rolling walker       Vision Baseline Vision/History: 1 Wears glasses Patient Visual Report: Other (comment) (Pt reported that she wears glasses, however has not picked up her new prescription and has been wearing her old pair as needed. Pt reported an instance of blurred vision yesterday that lasted "for a couple of minutes", otherwise no changes from baseline.) Vision Assessment?: No apparent visual deficits     Perception     Praxis      Pertinent Vitals/Pain Pain Assessment: 0-10 Pain Score: 8  Pain Location: L knee and R thigh Pain Descriptors / Indicators: Aching;Guarding;Tightness Pain Intervention(s): Monitored during session;Repositioned;Ice applied     Hand Dominance Right   Extremity/Trunk Assessment Upper Extremity Assessment Upper Extremity Assessment: Overall WFL for tasks assessed   Lower Extremity Assessment Lower Extremity Assessment: Defer to PT evaluation   Cervical / Trunk Assessment Cervical / Trunk Assessment: Normal   Communication Communication Communication: No difficulties   Cognition Arousal/Alertness: Awake/alert Behavior During  Therapy: WFL for tasks assessed/performed Overall Cognitive Status: Within Functional Limits for tasks assessed                                     General Comments       Exercises     Shoulder Instructions      Home Living Family/patient expects to be  discharged to:: Private residence Living Arrangements: Non-relatives/Friends Available Help at Discharge: Friend(s);Available PRN/intermittently Type of Home: Apartment Home Access: Stairs to enter Entrance Stairs-Number of Steps: 36 Entrance Stairs-Rails: Left;Right Home Layout: One level     Bathroom Shower/Tub: Chief Strategy Officer: Standard     Home Equipment: None          Prior Functioning/Environment Level of Independence: Independent                 OT Problem List: Decreased strength;Decreased activity tolerance;Impaired balance (sitting and/or standing);Decreased knowledge of use of DME or AE;Pain      OT Treatment/Interventions: Self-care/ADL training;Therapeutic exercise;Neuromuscular education;Energy conservation;DME and/or AE instruction;Therapeutic activities;Patient/family education;Balance training    OT Goals(Current goals can be found in the care plan section) Acute Rehab OT Goals Patient Stated Goal: decrease pain, return home OT Goal Formulation: With patient Time For Goal Achievement: 01/15/21 Potential to Achieve Goals: Good  OT Frequency: Min 2X/week   Barriers to D/Mcbride: Inaccessible home environment  Pt with decreased balance/mobility and 36 steps to enter home.       Co-evaluation              AM-PAC OT "6 Clicks" Daily Activity     Outcome Measure Help from another person eating meals?: None Help from another person taking care of personal grooming?: None Help from another person toileting, which includes using toliet, bedpan, or urinal?: A Little Help from another person bathing (including washing, rinsing, drying)?: A Little Help from another person to put on and taking off regular upper body clothing?: None Help from another person to put on and taking off regular lower body clothing?: None 6 Click Score: 22   End of Session Equipment Utilized During Treatment: Rolling walker;Gait belt Nurse Communication:  Mobility status  Activity Tolerance: Patient limited by pain Patient left: in chair;with call bell/phone within reach;with chair alarm set  OT Visit Diagnosis: Unsteadiness on feet (R26.81);Muscle weakness (generalized) (M62.81);Other symptoms and signs involving the nervous system (R29.898);Pain                Time: 6578-4696 OT Time Calculation (min): 22 min Charges:  OT General Charges $OT Visit: 1 Visit OT Evaluation $OT Eval Moderate Complexity: 1 Mod  Anita Mcbride, Anita Mcbride  Acute Rehab [phone]   Anita Mcbride 01/01/2021, 2:35 PM

## 2021-01-01 NOTE — Progress Notes (Signed)
Physical Therapy Treatment Patient Details Name: Anita Mcbride MRN: [id number] DOB: 2001/02/28 Today's Date: 01/01/2021   History of Present Illness 20 yo female with onset of BLE pain and weakness was brought to hosp on 10/3, with mult tests so far indicating possible MS due to corona radiata findings on MRI.  Has numbness on LLE initially now on BLE's mainly around the feet.  PMHx:  seasonal allergies, HTN, PTSD    PT Comments    Continuing work on functional mobility and activity tolerance;  Overall success with using rollator RW for amb, but distance limited by pain and burning in both LEs (and especially in LLE);    Will need stair practice next session;   While I believe Outpt PT is appropriate for her, I don't know about her ability to get rides, and I'm concerned that she might not have needed assist at home  Recommendations for follow up therapy are one component of a multi-disciplinary discharge planning process, led by the attending physician.  Recommendations may be updated based on patient status, additional functional criteria and insurance authorization.  Follow Up Recommendations  Home health PT;Supervision for mobility/OOB;Other (comment) (Not sure that pt's insurance supports HHPT; Worth looking into outpt PT (Neurorehab))     Equipment Recommendations  Other (comment) (Rollator walker)    Recommendations for Other Services       Precautions / Restrictions Precautions Precautions: Fall Precaution Comments: anxiety Restrictions Weight Bearing Restrictions: No     Mobility  Bed Mobility Overal bed mobility: Needs Assistance Bed Mobility: Supine to Sit     Supine to sit: Min guard     General bed mobility comments: min guard for safety; noting active moveent in LEs, tended to use her UEs ot move her legs    Transfers Overall transfer level: Needs assistance Equipment used: 4-wheeled walker Transfers: Sit to/from Stand Sit to Stand: Min guard (with  and without physical contact)         General transfer comment: Cues for hand placement  Ambulation/Gait Ambulation/Gait assistance: Min guard (with physical contact) Gait Distance (Feet): 40 Feet (in and around length and area of room) Assistive device: 4-wheeled walker Gait Pattern/deviations: Decreased stride length (erratic step length)     General Gait Details: pt was able to be assisted min guard but worsens B knee pain with mobility, and is unsteady on turning activity; noting small L knee buckles -- was able to support herself on RW   Stairs             Wheelchair Mobility    Modified Rankin (Stroke Patients Only)       Balance Overall balance assessment: Needs assistance Sitting-balance support: Feet supported Sitting balance-Leahy Scale: Good     Standing balance support: Bilateral upper extremity supported Standing balance-Leahy Scale: Fair Standing balance comment: less than fair at times, catches herself                            Cognition Arousal/Alertness: Awake/alert Behavior During Therapy: WFL for tasks assessed/performed Overall Cognitive Status: Within Functional Limits for tasks assessed                                        Exercises      General Comments        Pertinent Vitals/Pain Pain Assessment: Faces Pain Score: 8  Faces Pain Scale: Hurts even more Pain Location: L knee and R thigh Pain Descriptors / Indicators: Burning;Guarding Pain Intervention(s): Monitored during session    Home Living Family/patient expects to be discharged to:: Private residence Living Arrangements: Non-relatives/Friends Available Help at Discharge: Friend(s);Available PRN/intermittently Type of Home: Apartment Home Access: Stairs to enter Entrance Stairs-Rails: Left;Right Home Layout: One level Home Equipment: None      Prior Function Level of Independence: Independent          PT Goals (current goals can  now be found in the care plan section) Acute Rehab PT Goals Patient Stated Goal: decrease pain, return home PT Goal Formulation: With patient Time For Goal Achievement: 01/14/21 Potential to Achieve Goals: Good Progress towards PT goals: Not progressing toward goals - comment (slowly; distance limited by pain today)    Frequency    Min 3X/week      PT Plan Current plan remains appropriate;Other (comment) (will consider Outpt PT follow up)    Co-evaluation              AM-PAC PT "6 Clicks" Mobility   Outcome Measure  Help needed turning from your back to your side while in a flat bed without using bedrails?: A Little Help needed moving from lying on your back to sitting on the side of a flat bed without using bedrails?: A Little Help needed moving to and from a bed to a chair (including a wheelchair)?: A Little Help needed standing up from a chair using your arms (e.g., wheelchair or bedside chair)?: A Little Help needed to walk in hospital room?: A Little Help needed climbing 3-5 steps with a railing? : Total 6 Click Score: 16    End of Session Equipment Utilized During Treatment: Gait belt Activity Tolerance: Patient limited by fatigue;Patient limited by pain Patient left: in chair;with call bell/phone within reach;with chair alarm set (eating lunch) Nurse Communication: Mobility status PT Visit Diagnosis: Unsteadiness on feet (R26.81);Muscle weakness (generalized) (M62.81);Pain Pain - Right/Left:  (both) Pain - part of body: Knee     Time: 1310-1335 PT Time Calculation (min) (ACUTE ONLY): 25 min  Charges:  $Gait Training: 23-37 mins                     Van Clines,   Acute Rehabilitation Services Pager [phone] Office [phone]    Levi Aland 01/01/2021, 4:03 PM

## 2021-01-01 NOTE — Discharge Summary (Signed)
Name: Anita Mcbride MRN: [id number] DOB: Feb 10, 2001 20 y.o. PCP: Pcp, No  Date of Admission: 12/31/2020 12:00 AM Date of Discharge: 01/01/21 Attending Physician: Miguel Aschoff, MD  Discharge Diagnosis: 1. Functional Neurologic Disorder 2. Meralgia Paresthetica 3. Hypertension 4. Microcytic Anemia 5. Anxiety   Discharge Medications: Allergies as of 01/01/2021   No Known Allergies      Medication List     TAKE these medications    acetaminophen 500 MG tablet Commonly known as: TYLENOL Take 2 tablets (1,000 mg total) by mouth every 8 (eight) hours as needed. For pain   DULoxetine 30 MG capsule Commonly known as: CYMBALTA Take 1 capsule (30 mg total) by mouth daily. For anxiety and pain Start taking on: January 02, 2021   famotidine 20 MG tablet Commonly known as: PEPCID Take 1 tablet (20 mg total) by mouth 2 (two) times daily.   naproxen 500 MG tablet Commonly known as: NAPROSYN Take 1 tablet (500 mg total) by mouth 2 (two) times daily with a meal for 7 days, THEN 1 tablet (500 mg total) 2 (two) times daily as needed for up to 7 days. For pain. Start taking on: January 01, 2021               Durable Medical Equipment  (From admission, onward)           Start     Ordered   01/01/21 1627  DME Walker  Once       Comments: Rollator walker  Question Answer Comment  Walker: With 5 Inch Wheels   Patient needs a walker to treat with the following condition Leg pain, bilateral      01/01/21 1627   01/01/21 1513  For home use only DME 4 wheeled rolling walker with seat  Once       Question:  Patient needs a walker to treat with the following condition  Answer:  Weakness   01/01/21 1512            Disposition and follow-up:   [name] was discharged from [facility] in Stable condition.  At the hospital follow up visit please address:  1.  Bilateral Leg Pain and weakness: Patient had leg pain that was migratory and  weakness that was perceived by the patient but not on physical exam.  2.  Meralgia Paresthetica: Complained of burning thigh sensation consistent with merlagia paresthetica.  3. Hypertension: Reported a history of htn. No on any medications. Would continue to monitor in outpatient setting as she also has anxiety. 4. Microcytic Anemia: Had low hgb at 11 but states she has heavy menstrual bleeding.  5. Anxiety: Feels like she is always worrying about different things. Was discharged with Cymbalta. Please evaluate further.   2.  Labs / imaging needed at time of follow-up: None  3.  Pending labs/ test needing follow-up: ANA, Vitamin B6, B1,   Follow-up Appointments:  Follow-up Information     [facility]. Call in 1 day(s).   Why: Please schedule an apointment for a follow up and to establish care as you don't have a primary care. Contact information: [address] [place] [address] [phone]                Hospital Course by problem list: 1. Bilateral leg pain and numbness Patient presented with bilateral leg pain.  Patient states that her initial symptoms started approximately a week ago, and only  affected the left lower extremity. She states that her pain is felt in her muscles on the left side. Initially, her symptoms started around the knee around the knee progressed to up to her left thigh.  Additionally, she endorses new pain and weakness that started yesterday in the right lower extremity. This pain originates at the right knee and goes below to her feet.  Her bilateral leg pain pain is described as cramping in character.  Relaxing her muscles appears to aggravate the pain.  She has not tried medications, but has not been able to find alleviating factors. Multiple tests were performed but all were negative for any etiology. Her symptoms were inconsistent and changed with each examination. She has anxiety and is under going a lot of stress.  Her consistent symptom was burning in the thigh region consistent with Meralgia Paresthetica. Please follow up on her symptom resolution. She was discharged with naproxen and Cymbalta along with instructions to use lidocaine cream as needed.  Discharge Subjective: Patient feeling well. She states she is still having pain but it comes and goes. She had bilateral thigh pain that was consistent with Meralgia Paresthetica.  Discharge Exam:   BP 123/77 (BP Location: Left Arm)   Pulse 84   Temp 98.9 F (37.2 C) (Oral)   Resp 19   Ht 5\' 7"  (1.702 m)   Wt 108.9 kg   SpO2 98%   BMI 37.59 kg/m   Physical Exam Constitutional:      General: She is not in acute distress.    Appearance: Normal appearance.  HENT:     Head: Normocephalic and atraumatic.     Right Ear: External ear normal.     Left Ear: External ear normal.     Nose: Nose normal.     Mouth/Throat:     Mouth: Mucous membranes are moist.     Pharynx: Oropharynx is clear. No posterior oropharyngeal erythema.  Cardiovascular:     Comments: Pulses normal.  Pulmonary:     Comments: Normal work of breathing Abdominal:     Palpations: Abdomen is soft.     Tenderness: There is no abdominal tenderness.  Musculoskeletal:        General: Tenderness (Tenderness to palpation but not reproducible in lower extremities) present. No swelling. Normal range of motion.     Right lower leg: No edema.     Left lower leg: No edema.  Skin:    General: Skin is warm and dry.     Capillary Refill: Capillary refill takes less than 2 seconds.     Findings: No erythema.  Neurological:     General: No focal deficit present.     Mental Status: She is alert and oriented to person, place, and time.     Motor: No weakness.  Psychiatric:        Mood and Affect: Mood normal.        Behavior: Behavior normal.     Pertinent Labs, Studies, and Procedures:  CBC Latest Ref Rng & Units 12/31/2020 12/10/2017 12/25/2011  WBC 4.0 - 10.5 K/uL 9.7 5.7 -  Hemoglobin  12.0 - 15.0 g/dL 11.0(L) 12.1 12.9  Hematocrit 36.0 - 46.0 % 35.9(L) 38.6 38.0  Platelets 150 - 400 K/uL 360 343 -    CMP Latest Ref Rng & Units 12/31/2020 12/10/2017 12/25/2011  Glucose 70 - 99 mg/dL 89 96 73  BUN 6 - 20 mg/dL 11 10 10   Creatinine 0.44 - 1.00 mg/dL 12/27/2011 0.25  Sodium  135 - 145 mmol/L 134(L) 139 138  Potassium 3.5 - 5.1 mmol/L 4.3 3.4(L) 4.1  Chloride 98 - 111 mmol/L 104 104 102  CO2 22 - 32 mmol/L 24 24 -  Calcium 8.9 - 10.3 mg/dL 9.3 9.1 -  Total Protein 6.5 - 8.1 g/dL 7.6 7.4 -  Total Bilirubin 0.3 - 1.2 mg/dL 1.2 1.0 -  Alkaline Phos 38 - 126 U/L 58 70 -  AST 15 - 41 U/L 17 17 -  ALT 0 - 44 U/L 14 12 -    Iron/TIBC/Ferritin/ %Sat    Component Value Date/Time   IRON 97 12/31/2020 1230   TIBC 528 (H) 12/31/2020 1230   FERRITIN 14 12/31/2020 1230   IRONPCTSAT 18 12/31/2020 1230    RF: Negative Aldolase: negative HIV: negative ACE negative CK negative TSH negative  MR BRAIN W WO CONTRAST  Result Date: 12/31/2020 CLINICAL DATA:  Multiple sclerosis, left leg numbness starting a week ago, right leg numbness starting today EXAM: MRI HEAD WITHOUT AND WITH CONTRAST TECHNIQUE:  IMPRESSION: 1. Single focus of FLAIR signal abnormality in the right corona radiata is nonspecific but could be seen with multiple sclerosis. There is no enhancement to suggest active demyelination. 2. No other lesions identified. Electronically Signed   By: Lesia Hausen M.D.   On: 12/31/2020 09:41   MR LUMBAR SPINE WO CONTRAST  Result Date: 12/31/2020 CLINICAL DATA:  Low back pain radiating down both legs. History of multiple sclerosis. EXAM: MRI LUMBAR SPINE WITHOUT CONTRAST TECHNIQUE:  IMPRESSION: 1. Normal MRI of the lumbar spine. Electronically Signed   By: Obie Dredge M.D.   On: 12/31/2020 18:57   MR CERVICAL SPINE W WO CONTRAST  Result Date: 12/31/2020 CLINICAL DATA:  Spinal stenosis. Suspicion of demyelinating disease. Left leg numbness for 1 week with right-sided symptoms  today. EXAM: MRI CERVICAL AND THORACIC SPINE WITHOUT AND WITH CONTRAST TECHNIQUE:  IMPRESSION: 1. Negative MRI's of the cervical and thoracic spine. No evidence of demyelinating process or abnormal intradural enhancement. 2. No significant spondylosis, spinal stenosis or nerve root encroachment. 3. Prominent adenoid tissue noted. Electronically Signed   By: Carey Bullocks M.D.   On: 12/31/2020 09:38   MR THORACIC SPINE W WO CONTRAST  Result Date: 12/31/2020 CLINICAL DATA:  Spinal stenosis. Suspicion of demyelinating disease. Left leg numbness for 1 week with right-sided symptoms today. EXAM: MRI CERVICAL AND THORACIC SPINE WITHOUT AND WITH CONTRAST TECHNIQUE:  IMPRESSION: 1. Negative MRI's of the cervical and thoracic spine. No evidence of demyelinating process or abnormal intradural enhancement. 2. No significant spondylosis, spinal stenosis or nerve root encroachment. 3. Prominent adenoid tissue noted. Electronically Signed   By: Carey Bullocks M.D.   On: 12/31/2020 09:38   MR FEMUR LEFT WO CONTRAST  Result Date: 12/31/2020 CLINICAL DATA:  Left leg pain. History of multiple sclerosis. Evaluate for myositis. EXAM: MR OF THE LEFT FEMUR WITHOUT CONTRAST TECHNIQUE: IMPRESSION: 1. Normal MRI of the left femur. No evidence of myositis. Electronically Signed   By: Obie Dredge M.D.   On: 12/31/2020 18:39   VAS [place] LOWER EXTREMITY VENOUS (DVT) (ONLY [facility] & [facility])  Result Date: 12/31/2020  Lower Venous DVT Study Patient Name:  BERDIE MALTER  Date of Exam:   12/31/2020 Medical Rec #: [id number]        Accession #:    [id number] Date of Birth: Apr 30, 2000       Patient Gender: F Patient Age:   19 years Exam Location:  [facility]  Procedure:      VAS [place] LOWER EXTREMITY VENOUS (DVT) Summary: RIGHT: - There is no evidence of deep vein thrombosis in the lower extremity. However, portions of this examination were limited- see technologist comments above.  LEFT: - There is no evidence of deep vein thrombosis in  the lower extremity. However, portions of this examination were limited- see technologist comments above.  *See table(s) above for measurements and observations. Electronically signed by Lemar Livings MD on 12/31/2020 at 4:57:02 PM.    Final      Discharge Instructions: Discharge Instructions     Call MD for:   Complete by: As directed    Chest pain or shortness of breath   Call MD for:  difficulty breathing, headache or visual disturbances   Complete by: As directed    Call MD for:  extreme fatigue   Complete by: As directed    Call MD for:  hives   Complete by: As directed    Call MD for:  persistant dizziness or light-headedness   Complete by: As directed    Call MD for:  persistant nausea and vomiting   Complete by: As directed    Call MD for:  redness, tenderness, or signs of infection (pain, swelling, redness, odor or green/yellow discharge around incision site)   Complete by: As directed    Call MD for:  temperature >100.4   Complete by: As directed    Diet - low sodium heart healthy   Complete by: As directed    Diet general   Complete by: As directed    Discharge instructions   Complete by: As directed    It was a pleasure taking care of you!  You came to the ED with bilateral leg pain. We performed multiple tests but they all came back negative. During our exam, you stated you had burning pain on both thighs that was a condition called Meralgia Paresthetica which is due to irritation of a nerve located in the hip. This can cause burning pain or numbness in the thighs. Lidocaine 4% topical cream that you can get from [facility] or drug store will help you with this pain as well. Please use that as indicated on the instructions.   You appeared very anxious on your exam, I believe this along with recent stressors may be contributing in part to your condition. We will prescribe a medication called Naproxen 500 mg BID for pain. Please take it twice daily for 7 days and then as needed  for pain. This is an NSAID and should help with pain. We will also prescribe Cymbalta also called Duloxetine which helps with anxiety and pain.   You were slightly anemic and you endorsed heavy menstrual bleeding. You can supplement with OTC Iron 65 mg (325 mg Ferrous Sulfate). We will check you blood levels in one of the follow up visit in the clinic.   We would like to see you in the clinic for a hospital follow up and to get you established. Please call the clinic to get an appointment at [phone].   Increase activity slowly   Complete by: As directed    Increase activity slowly   Complete by: As directed        Signed: Gwenevere Abbot, MD Eligha Bridegroom. [facility], PGY-1  01/01/2021, 6:20 PM   Pager: [phone]

## 2021-01-02 LAB — PROTEIN ELECTROPHORESIS, SERUM
A/G Ratio: 1 (ref 0.7–1.7)
Albumin ELP: 3.6 g/dL (ref 2.9–4.4)
Alpha-1-Globulin: 0.3 g/dL (ref 0.0–0.4)
Alpha-2-Globulin: 0.8 g/dL (ref 0.4–1.0)
Beta Globulin: 1.2 g/dL (ref 0.7–1.3)
Gamma Globulin: 1.2 g/dL (ref 0.4–1.8)
Globulin, Total: 3.5 g/dL (ref 2.2–3.9)
Total Protein ELP: 7.1 g/dL (ref 6.0–8.5)

## 2021-01-02 LAB — METHYLMALONIC ACID, SERUM: Methylmalonic Acid, Quantitative: 120 nmol/L (ref 0–378)

## 2021-01-02 LAB — ANTINUCLEAR ANTIBODIES, IFA: ANA Ab, IFA: NEGATIVE

## 2021-01-02 LAB — VITAMIN B6: Vitamin B6: 8.2 ug/L (ref 3.4–65.2)

## 2021-01-10 ENCOUNTER — Other Ambulatory Visit: Payer: Self-pay

## 2021-01-10 ENCOUNTER — Ambulatory Visit (HOSPITAL_COMMUNITY)
Admission: EM | Admit: 2021-01-10 | Discharge: 2021-01-10 | Disposition: A | Discharge status: Home / Self Care | Payer: Medicaid Other | Attending: Emergency Medicine | Admitting: Emergency Medicine

## 2021-01-10 ENCOUNTER — Encounter (HOSPITAL_COMMUNITY): Payer: Self-pay | Admitting: *Deleted

## 2021-01-10 DIAGNOSIS — G5713 Meralgia paresthetica, bilateral lower limbs: Secondary | ICD-10-CM | POA: Diagnosis not present

## 2021-01-10 NOTE — Discharge Instructions (Addendum)
Please call the [facility] internal medicine clinic ASAP to arrange a follow-up appointment.  Their phone number is [phone]. I am glad that you are feeling better.

## 2021-01-10 NOTE — ED Provider Notes (Signed)
HPI  SUBJECTIVE:  Anita Mcbride is a 20 y.o. female who presents for follow-up and a note to return to work. Patient was admitted to the hospital for work-up of bilateral leg numbness on 10/3.  CVA, spinal nerve root cord compromise and MS was ruled out.  It was thought that she had bilateral meralgia paresthetica and was sent home on 10/4 with Cymbalta, lidocaine cream, NSAIDs, a walker and was advised to follow-up with the [facility] internal medicine center.  She states that she is feeling well, symptoms have totally resolved, but she is able to walk without any problems.  She works as a Electrical engineer.  Past medical history of hypertension, anxiety.  Past Medical History:  Diagnosis Date   Seasonal allergies     Past Surgical History:  Procedure Laterality Date   tubes in ears      History reviewed. No pertinent family history.  Social History   Tobacco Use   Smoking status: Never   Smokeless tobacco: Never  Vaping Use   Vaping Use: Never used  Substance Use Topics   Alcohol use: Not Currently   Drug use: Not Currently    No current facility-administered medications for this encounter.  Current Outpatient Medications:    acetaminophen (TYLENOL) 500 MG tablet, Take 2 tablets (1,000 mg total) by mouth every 8 (eight) hours as needed. For pain, Disp: 100 tablet, Rfl: 2   DULoxetine (CYMBALTA) 30 MG capsule, Take 1 capsule (30 mg total) by mouth daily. For anxiety and pain, Disp: 30 capsule, Rfl: 0   naproxen (NAPROSYN) 500 MG tablet, Take 1 tablet (500 mg total) by mouth 2 (two) times daily with a meal for 7 days, THEN 1 tablet (500 mg total) 2 (two) times daily as needed for up to 7 days. For pain., Disp: 25 tablet, Rfl: 0  No Known Allergies   ROS  As noted in HPI.   Physical Exam  BP 110/73   Pulse 90   Temp 98.1 F (36.7 C)   Resp 20   LMP 01/10/2021   SpO2 96%   Constitutional: Well developed, well nourished, no acute distress Eyes:  EOMI, conjunctiva normal  bilaterally HENT: Normocephalic, atraumatic,mucus membranes moist Respiratory: Normal inspiratory effort Cardiovascular: Normal rate GI: nondistended skin: No rash, skin intact Musculoskeletal: no deformities Neurologic: Alert & oriented x 3, no focal neuro deficits.  Sensation and motor intact in bilateral lower extremities. Psychiatric: Speech and behavior appropriate   ED Course   Medications - No data to display  No orders of the defined types were placed in this encounter.   No results found for this or any previous visit (from the past 24 hour(s)). No results found.  ED Clinical Impression  1. Meralgia paresthetica of both lower extremities      ED Assessment/Plan  Previous records reviewed.  As noted in HPI  Patient states that all of her symptoms have resolved.  She is continuing to take the Cymbalta and states that it is helping with her anxiety.  Encouraged her to follow-up with the internal medicine clinic for ongoing management.  She agrees to make an appointment ASAP.  Wrote a work note clearing her to return to work without restrictions.  No orders of the defined types were placed in this encounter.     *This clinic note was created using Dragon dictation software. Therefore, there may be occasional mistakes despite careful proofreading.  ?    Domenick Gong, MD 01/11/21 [phone]

## 2021-01-10 NOTE — ED Triage Notes (Signed)
Pt here today for a work note to return to work after seh was seen in ED on 12-31-20 for leg pain.

## 2021-04-18 ENCOUNTER — Emergency Department (HOSPITAL_COMMUNITY)
Admission: EM | Admit: 2021-04-18 | Discharge: 2021-04-19 | Disposition: A | Discharge status: Home / Self Care | Payer: Medicaid Other | Attending: Emergency Medicine | Admitting: Emergency Medicine

## 2021-04-18 ENCOUNTER — Other Ambulatory Visit: Payer: Self-pay

## 2021-04-18 DIAGNOSIS — M791 Myalgia, unspecified site: Secondary | ICD-10-CM | POA: Insufficient documentation

## 2021-04-18 DIAGNOSIS — M5432 Sciatica, left side: Secondary | ICD-10-CM | POA: Diagnosis not present

## 2021-04-18 DIAGNOSIS — M5431 Sciatica, right side: Secondary | ICD-10-CM

## 2021-04-18 DIAGNOSIS — Z79899 Other long term (current) drug therapy: Secondary | ICD-10-CM | POA: Insufficient documentation

## 2021-04-18 DIAGNOSIS — G5703 Lesion of sciatic nerve, bilateral lower limbs: Secondary | ICD-10-CM | POA: Insufficient documentation

## 2021-04-18 DIAGNOSIS — M79606 Pain in leg, unspecified: Secondary | ICD-10-CM | POA: Diagnosis present

## 2021-04-18 DIAGNOSIS — Z5321 Procedure and treatment not carried out due to patient leaving prior to being seen by health care provider: Secondary | ICD-10-CM | POA: Insufficient documentation

## 2021-04-18 LAB — CBC WITH DIFFERENTIAL/PLATELET
Abs Immature Granulocytes: 0.02 10*3/uL (ref 0.00–0.07)
Basophils Absolute: 0.1 10*3/uL (ref 0.0–0.1)
Basophils Relative: 1 %
Eosinophils Absolute: 0.3 10*3/uL (ref 0.0–0.5)
Eosinophils Relative: 4 %
HCT: 38.4 % (ref 36.0–46.0)
Hemoglobin: 11.9 g/dL — ABNORMAL LOW (ref 12.0–15.0)
Immature Granulocytes: 0 %
Lymphocytes Relative: 35 %
Lymphs Abs: 2.3 10*3/uL (ref 0.7–4.0)
MCH: 24.9 pg — ABNORMAL LOW (ref 26.0–34.0)
MCHC: 31 g/dL (ref 30.0–36.0)
MCV: 80.5 fL (ref 80.0–100.0)
Monocytes Absolute: 0.5 10*3/uL (ref 0.1–1.0)
Monocytes Relative: 8 %
Neutro Abs: 3.3 10*3/uL (ref 1.7–7.7)
Neutrophils Relative %: 52 %
Platelets: 373 10*3/uL (ref 150–400)
RBC: 4.77 MIL/uL (ref 3.87–5.11)
RDW: 14.2 % (ref 11.5–15.5)
WBC: 6.4 10*3/uL (ref 4.0–10.5)
nRBC: 0 % (ref 0.0–0.2)

## 2021-04-18 LAB — BASIC METABOLIC PANEL
Anion gap: 9 (ref 5–15)
BUN: 7 mg/dL (ref 6–20)
CO2: 25 mmol/L (ref 22–32)
Calcium: 9.6 mg/dL (ref 8.9–10.3)
Chloride: 105 mmol/L (ref 98–111)
Creatinine, Ser: 0.83 mg/dL (ref 0.44–1.00)
GFR, Estimated: >60 mL/min (ref >60)
Glucose, Bld: 80 mg/dL (ref 70–99)
Potassium: 3.6 mmol/L (ref 3.5–5.1)
Sodium: 139 mmol/L (ref 135–145)

## 2021-04-18 NOTE — ED Provider Triage Note (Signed)
Emergency Medicine Provider Triage Evaluation Note  Anita Mcbride , a 21 y.o. female  was evaluated in triage.  Pt complains of leg pain.  Worse on the left lower extremity, also feels it on the right thigh.  Pain is burning, worse with ambulation.  She does feel short of breath when she walks, not at rest.  No chest pain, not on oral birth control, no history of PE or DVT.  States it feels the same as it did in October when she was seen previously.  Typically improved with Cymbalta and lidocaine gel, has not been able to follow-up with PCP and has been out of meds for some time.  Has not had neurology follow-up..  Review of Systems  Positive: LEG PAIN Negative: CP  Physical Exam  BP (!) 139/59 (BP Location: Left Arm)    Pulse 88    Temp 98.8 F (37.1 C) (Oral)    Resp 19    Ht 5\' 7"  (1.702 m)    Wt 108.9 kg    SpO2 97%    BMI 37.59 kg/m  Gen:   Awake, no distress   Resp:  Normal effort  MSK:   Moves extremities without difficulty  Other:  DP PT 2+ bilaterally.  Legs are roughly symmetric, no erythema.  Medical Decision Making  Medically screening exam initiated at 8:53 PM.  Appropriate orders placed.  was informed that the remainder of the evaluation will be completed by another provider, this initial triage assessment does not replace that evaluation, and the importance of remaining in the ED until their evaluation is complete.  Patient had MRI and extensive imaging performed in October of last year.  Given it seems the same, will check some basic labs and pregnancy in case further imaging is warranted.  She is PERC negative and having bilateral symptoms making DVT somewhat less likely, will start with basic labs.   November, PA-C 04/18/21 2054

## 2021-04-18 NOTE — ED Triage Notes (Signed)
Pt c/o increased swelling and leg pain on left leg described as burning since December. Similar symptoms in October was treated for meralgia paresthetica at that time.

## 2021-04-19 MED ORDER — ACETAMINOPHEN 500 MG PO TABS
1000.0000 mg | ORAL_TABLET | Freq: Once | ORAL | Status: AC
  Administered 2021-04-19: 1000 mg via ORAL
  Filled 2021-04-19: qty 2

## 2021-04-19 MED ORDER — OXYCODONE HCL 5 MG PO TABS
5.0000 mg | ORAL_TABLET | Freq: Once | ORAL | Status: AC
  Administered 2021-04-19: 5 mg via ORAL
  Filled 2021-04-19: qty 1

## 2021-04-19 MED ORDER — KETOROLAC TROMETHAMINE 15 MG/ML IJ SOLN
15.0000 mg | Freq: Once | INTRAMUSCULAR | Status: AC
  Administered 2021-04-19: 15 mg via INTRAMUSCULAR
  Filled 2021-04-19: qty 1

## 2021-04-19 NOTE — Discharge Instructions (Signed)

## 2021-04-19 NOTE — ED Notes (Signed)
LWBS 

## 2021-04-19 NOTE — ED Provider Notes (Signed)
[facility] EMERGENCY DEPARTMENT Provider Note   CSN: [id number] Arrival date & time: 04/18/21  1953     History  Chief Complaint  Patient presents with   Leg Pain    Anita Mcbride is a 21 y.o. female.  21 yo F with a chief complaints of pain to the anterior aspect of both of her legs.  This been going on for a couple days.  Feels similar to an episode that she had that brought her into the hospital.  At that time she had multiple MRIs that did not show any concerning finding for MS or spinal stenosis and she was discharged home.  She denies any recent trauma.  Pain is worse with certain positions walking sitting.  Denies loss of bowel or bladder denies loss of rectal sensation.  Does feel like she has some numbness to the legs off and on which was similar to her prior presentation.  She feels like the pain is a bit more prevalent than it was last time.   Leg Pain Associated symptoms: back pain   Associated symptoms: no fever       Home Medications Prior to Admission medications   Medication Sig Start Date End Date Taking? Authorizing Provider  acetaminophen (TYLENOL) 500 MG tablet Take 2 tablets (1,000 mg total) by mouth every 8 (eight) hours as needed. For pain 01/01/21 01/01/22  Idamae Schuller, MD  DULoxetine (CYMBALTA) 30 MG capsule Take 1 capsule (30 mg total) by mouth daily. For anxiety and pain 01/02/21 02/01/21  Idamae Schuller, MD      Allergies    Patient has no known allergies.    Review of Systems   Review of Systems  Constitutional:  Negative for chills and fever.  HENT:  Negative for congestion and rhinorrhea.   Eyes:  Negative for redness and visual disturbance.  Respiratory:  Negative for shortness of breath and wheezing.   Cardiovascular:  Negative for chest pain and palpitations.  Gastrointestinal:  Negative for nausea and vomiting.  Genitourinary:  Negative for dysuria and urgency.  Musculoskeletal:  Positive for arthralgias, back pain and  myalgias.  Skin:  Negative for pallor and wound.  Neurological:  Negative for dizziness and headaches.   Physical Exam Updated Vital Signs BP 132/83 (BP Location: Right Arm)    Pulse 84    Temp 97.9 F (36.6 C) (Oral)    Resp 18    Ht 5\' 7"  (1.702 m)    Wt 108.9 kg    SpO2 100%    BMI 37.59 kg/m  Physical Exam Vitals and nursing note reviewed.  Constitutional:      General: She is not in acute distress.    Appearance: She is well-developed. She is not diaphoretic.  HENT:     Head: Normocephalic and atraumatic.  Eyes:     Pupils: Pupils are equal, round, and reactive to light.  Cardiovascular:     Rate and Rhythm: Normal rate and regular rhythm.     Heart sounds: No murmur heard.   No friction rub. No gallop.  Pulmonary:     Effort: Pulmonary effort is normal.     Breath sounds: No wheezing or rales.  Abdominal:     General: There is no distension.     Palpations: Abdomen is soft.     Tenderness: There is no abdominal tenderness.  Musculoskeletal:        General: Tenderness present.     Cervical back: Normal range of motion and  neck supple.     Comments: Mild diffuse low back pain.  No step-offs or deformities of the midline L-spine.  Pulse and motor intact to bilateral lower extremities.  Reflexes are 2+ and equal without clonus.  Subjective decrease sensation to the right foot mostly on the dorsal aspect.  Skin:    General: Skin is warm and dry.  Neurological:     Mental Status: She is alert and oriented to person, place, and time.  Psychiatric:        Behavior: Behavior normal.    ED Results / Procedures / Treatments   Labs (all labs ordered are listed, but only abnormal results are displayed) Labs Reviewed  CBC WITH DIFFERENTIAL/PLATELET - Abnormal; Notable for the following components:      Result Value   Hemoglobin 11.9 (*)    MCH 24.9 (*)    All other components within normal limits  BASIC METABOLIC PANEL  I-STAT BETA HCG BLOOD, ED ([facility], [facility], [facility] ONLY)     EKG None  Radiology No results found.  Procedures Procedures    Medications Ordered in ED Medications  acetaminophen (TYLENOL) tablet 1,000 mg (1,000 mg Oral Given 04/19/21 0928)  ketorolac (TORADOL) 15 MG/ML injection 15 mg (15 mg Intramuscular Given 04/19/21 0929)  oxyCODONE (Oxy IR/ROXICODONE) immediate release tablet 5 mg (5 mg Oral Given 04/19/21 [id number])    ED Course/ Medical Decision Making/ A&P                           Medical Decision Making Risk OTC drugs. Prescription drug management.   21 yo F with a chief complaints of pain to both of her legs.  She was seen in this hospital system for this previously.  Had MRI of the brain C-spine and T-spine with no obvious concerning finding for MS were spinal compression.  She was presumptively given the diagnosis of bilateral meralgia paresthetica.  Her symptoms had gotten better but reoccurred over the past few days.  I do not feel that repeat imaging today is warranted.  We will treat her pain.  There was some confusion about follow-up in the office, gave her the information for the internal medicine clinic who agreed to see her in the office but she did not feel like she can see them because she told me she did not have a family physician.  Also will give follow-up with neurology as this has been a recurrent issue.  I reviewed her medical record. She had a urgent care visits in October where she wanted to go back to work and was documented having no continued leg pain.  10:36 AM:  I have discussed the diagnosis/risks/treatment options with the patient and believe the pt to be eligible for discharge home to follow-up with PCP. We also discussed returning to the ED immediately if new or worsening sx occur. We discussed the sx which are most concerning (e.g., sudden worsening pain, fever, inability to tolerate by mouth) that necessitate immediate return. Medications administered to the patient during their visit and any new  prescriptions provided to the patient are listed below.  Medications given during this visit Medications  acetaminophen (TYLENOL) tablet 1,000 mg (1,000 mg Oral Given 04/19/21 0928)  ketorolac (TORADOL) 15 MG/ML injection 15 mg (15 mg Intramuscular Given 04/19/21 0929)  oxyCODONE (Oxy IR/ROXICODONE) immediate release tablet 5 mg (5 mg Oral Given 04/19/21 [id number])     The patient appears reasonably screen and/or stabilized for discharge and  I doubt any other medical condition or other [facility] requiring further screening, evaluation, or treatment in the ED at this time prior to discharge.          Final Clinical Impression(s) / ED Diagnoses Final diagnoses:  Bilateral sciatica    Rx / DC Orders ED Discharge Orders          Ordered    Ambulatory referral to Neurology       Comments: Recurrent leg paresthesias   04/19/21 0848              Deno Etienne, DO 04/19/21 1036

## 2021-05-13 ENCOUNTER — Ambulatory Visit: Payer: Medicaid Other | Admitting: Neurology

## 2021-06-25 ENCOUNTER — Encounter: Payer: Self-pay | Admitting: Neurology

## 2021-06-25 ENCOUNTER — Ambulatory Visit: Payer: Medicaid Other | Admitting: Neurology

## 2021-10-18 ENCOUNTER — Ambulatory Visit (HOSPITAL_COMMUNITY)
Admission: EM | Admit: 2021-10-18 | Discharge: 2021-10-19 | Disposition: A | Discharge status: Home / Self Care | Payer: Medicaid Other | Attending: Family | Admitting: Family

## 2021-10-18 ENCOUNTER — Encounter: Payer: Self-pay | Admitting: Neurology

## 2021-10-18 ENCOUNTER — Ambulatory Visit (INDEPENDENT_AMBULATORY_CARE_PROVIDER_SITE_OTHER): Payer: Medicaid Other | Admitting: Neurology

## 2021-10-18 VITALS — BP 127/89 | HR 78 | Ht 67.0 in | Wt 247.0 lb

## 2021-10-18 DIAGNOSIS — Z79899 Other long term (current) drug therapy: Secondary | ICD-10-CM | POA: Insufficient documentation

## 2021-10-18 DIAGNOSIS — R45851 Suicidal ideations: Secondary | ICD-10-CM

## 2021-10-18 DIAGNOSIS — Z602 Problems related to living alone: Secondary | ICD-10-CM | POA: Insufficient documentation

## 2021-10-18 DIAGNOSIS — F32A Depression, unspecified: Secondary | ICD-10-CM | POA: Insufficient documentation

## 2021-10-18 DIAGNOSIS — Z9151 Personal history of suicidal behavior: Secondary | ICD-10-CM | POA: Insufficient documentation

## 2021-10-18 DIAGNOSIS — R531 Weakness: Secondary | ICD-10-CM | POA: Diagnosis not present

## 2021-10-18 DIAGNOSIS — R202 Paresthesia of skin: Secondary | ICD-10-CM | POA: Insufficient documentation

## 2021-10-18 DIAGNOSIS — Z20822 Contact with and (suspected) exposure to covid-19: Secondary | ICD-10-CM | POA: Insufficient documentation

## 2021-10-18 LAB — CBC WITH DIFFERENTIAL/PLATELET
Abs Immature Granulocytes: 0.01 10*3/uL (ref 0.00–0.07)
Basophils Absolute: 0 10*3/uL (ref 0.0–0.1)
Basophils Relative: 1 %
Eosinophils Absolute: 0.2 10*3/uL (ref 0.0–0.5)
Eosinophils Relative: 4 %
HCT: 40.5 % (ref 36.0–46.0)
Hemoglobin: 12.6 g/dL (ref 12.0–15.0)
Immature Granulocytes: 0 %
Lymphocytes Relative: 41 %
Lymphs Abs: 2.1 10*3/uL (ref 0.7–4.0)
MCH: 24.8 pg — ABNORMAL LOW (ref 26.0–34.0)
MCHC: 31.1 g/dL (ref 30.0–36.0)
MCV: 79.6 fL — ABNORMAL LOW (ref 80.0–100.0)
Monocytes Absolute: 0.3 10*3/uL (ref 0.1–1.0)
Monocytes Relative: 6 %
Neutro Abs: 2.4 10*3/uL (ref 1.7–7.7)
Neutrophils Relative %: 48 %
Platelets: 387 10*3/uL (ref 150–400)
RBC: 5.09 MIL/uL (ref 3.87–5.11)
RDW: 14.8 % (ref 11.5–15.5)
WBC: 5.1 10*3/uL (ref 4.0–10.5)
nRBC: 0 % (ref 0.0–0.2)

## 2021-10-18 LAB — COMPREHENSIVE METABOLIC PANEL
ALT: 13 U/L (ref 0–44)
AST: 16 U/L (ref 15–41)
Albumin: 4.1 g/dL (ref 3.5–5.0)
Alkaline Phosphatase: 70 U/L (ref 38–126)
Anion gap: 9 (ref 5–15)
BUN: 6 mg/dL (ref 6–20)
CO2: 26 mmol/L (ref 22–32)
Calcium: 9.6 mg/dL (ref 8.9–10.3)
Chloride: 103 mmol/L (ref 98–111)
Creatinine, Ser: 0.86 mg/dL (ref 0.44–1.00)
GFR, Estimated: >60 mL/min (ref >60)
Glucose, Bld: 79 mg/dL (ref 70–99)
Potassium: 3.6 mmol/L (ref 3.5–5.1)
Sodium: 138 mmol/L (ref 135–145)
Total Bilirubin: 1.1 mg/dL (ref 0.3–1.2)
Total Protein: 7.9 g/dL (ref 6.5–8.1)

## 2021-10-18 LAB — POCT URINE DRUG SCREEN - MANUAL ENTRY (I-SCREEN)
POC Amphetamine UR: NOT DETECTED
POC Buprenorphine (BUP): NOT DETECTED
POC Cocaine UR: NOT DETECTED
POC Marijuana UR: POSITIVE — AB
POC Methadone UR: NOT DETECTED
POC Methamphetamine UR: NOT DETECTED
POC Morphine: NOT DETECTED
POC Oxazepam (BZO): NOT DETECTED
POC Oxycodone UR: NOT DETECTED
POC Secobarbital (BAR): NOT DETECTED

## 2021-10-18 LAB — MAGNESIUM: Magnesium: 2.1 mg/dL (ref 1.7–2.4)

## 2021-10-18 LAB — LIPID PANEL
Cholesterol: 219 mg/dL — ABNORMAL HIGH (ref 0–200)
HDL: 38 mg/dL — ABNORMAL LOW (ref >40)
LDL Cholesterol: 161 mg/dL — ABNORMAL HIGH (ref 0–99)
Total CHOL/HDL Ratio: 5.8 RATIO
Triglycerides: 101 mg/dL (ref <150)
VLDL: 20 mg/dL (ref 0–40)

## 2021-10-18 LAB — HEMOGLOBIN A1C
Hgb A1c MFr Bld: 4.9 % (ref 4.8–5.6)
Mean Plasma Glucose: 93.93 mg/dL

## 2021-10-18 LAB — RESP PANEL BY RT-PCR (FLU A&B, COVID) ARPGX2
Influenza A by PCR: NEGATIVE
Influenza B by PCR: NEGATIVE
SARS Coronavirus 2 by RT PCR: NEGATIVE

## 2021-10-18 LAB — TSH: TSH: 1.965 u[IU]/mL (ref 0.350–4.500)

## 2021-10-18 LAB — POCT PREGNANCY, URINE: Preg Test, Ur: NEGATIVE

## 2021-10-18 LAB — POC SARS CORONAVIRUS 2 AG: SARSCOV2ONAVIRUS 2 AG: NEGATIVE

## 2021-10-18 LAB — ETHANOL: Alcohol, Ethyl (B): <10 mg/dL (ref <10)

## 2021-10-18 MED ORDER — ACETAMINOPHEN 325 MG PO TABS
650.0000 mg | ORAL_TABLET | Freq: Four times a day (QID) | ORAL | Status: DC | PRN
  Administered 2021-10-18: 650 mg via ORAL
  Filled 2021-10-18: qty 2

## 2021-10-18 MED ORDER — HYDROXYZINE HCL 25 MG PO TABS: 25.0000 mg | ORAL_TABLET | Freq: Three times a day (TID) | ORAL | Status: DC | PRN

## 2021-10-18 MED ORDER — ALUM & MAG HYDROXIDE-SIMETH 200-200-20 MG/5ML PO SUSP: 30.0000 mL | ORAL | Status: DC | PRN

## 2021-10-18 MED ORDER — TRAZODONE HCL 50 MG PO TABS: 50.0000 mg | ORAL_TABLET | Freq: Every evening | ORAL | Status: DC | PRN

## 2021-10-18 MED ORDER — MAGNESIUM HYDROXIDE 400 MG/5ML PO SUSP: 30.0000 mL | Freq: Every day | ORAL | Status: DC | PRN

## 2021-10-18 MED ORDER — SERTRALINE HCL 50 MG PO TABS
50.0000 mg | ORAL_TABLET | Freq: Every day | ORAL | Status: DC
  Administered 2021-10-18 – 2021-10-19 (×2): 50 mg via ORAL
  Filled 2021-10-18 (×2): qty 1

## 2021-10-18 NOTE — ED Notes (Signed)
Patient arrived on unit. Patient given meal. Patient calm and cooperative. Patient safe on unit with continued monitoring.

## 2021-10-18 NOTE — ED Triage Notes (Signed)
Pt presents to [facility] voluntarily.Pt states she would like to get an assessment to see what "mental illness" she has. Pt states she used to self-harm but cutting herself when she gets triggered. Pt states yesterday she was in a groupchat on snapchat and everyone was talking about their depression and it triggered her to have urges to self-harm. Pt states she did not self-harm and has not self-harmed since the beginning of the year. Pt states " there is something callled NSSIB, I think that relates more to me because I don't want to kill myself, I just do it to relieve the pain of my past trauma". Pt denies any diagnosis. Pt denies having a therapist or psychiatrist. Pt states she was prescribed anxiety medication in the past but no longer has a prescription. Pt states she would like to be prescribed something for her anxiety and she would like resources for therapy. Pt denies SI/HI, NSSIB and AVH at this time.

## 2021-10-18 NOTE — ED Notes (Signed)
Pt admitted to overnight observation due to self harm thoughts yesterday. Today, pt denies SI/HI/AVH. Pt request to be started on anti-depressant to manage depression. Pt states, "I just get tired of hearing other people problems. It makes me want to cut myself but not to die, just to relieve the depression. I love myself. I haven't cut since Jan.". Pt observed to have old scarring to R arm. Pleasant throughout interview process. Oriented to unit and unit rules. Will monitor for safety.
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
## 2021-10-18 NOTE — Progress Notes (Signed)
Chief Complaint  Patient presents with   New Patient (Initial Visit)    Rm 15. Alone. NP internal referral for Recurrent leg paresthesias.      ASSESSMENT AND PLAN  Anita Mcbride is a 21 y.o. female   Intermittent paresthesia  Normal neurological examination today  Extensive evaluation in October 2022 showed no significant abnormality on MRI of brain, cervical, thoracic lumbar spine,  Advised to continue observe her symptoms, follow-up with primary care physician  Her symptoms can be related to her stress  DIAGNOSTIC DATA (LABS, IMAGING, TESTING) - I reviewed patient records, labs, notes, testing and imaging myself where available.   MEDICAL HISTORY:  Anita Mcbride, is a 21 year old female, seen in request by primary care doctor Melene Plan to follow-up for hospital discharge in October 2022  I reviewed and summarized the referring note.PMHX  She was admitted to [facility] October 2022 for acute onset of bilateral lower extremity numbness, weakness, to the point of difficulty walking,  I personally reviewed extensive evaluations, MRI of the brain showed single right frontal subcortical white matter signal abnormality, otherwise normal  MRI of the cervical, thoracic, lumbar spine showed no significant abnormality  Laboratory evaluation showed normal A1c 5.1, iron panel, HIV, TSH, folic acid, BMP, CBC showed borderline hemoglobin of 12, normal liver function test  She reported initial symptoms last about 2 hours, later on she had intermittent lower extremity pain and paresthesia, but asymptomatic now  She does complains of significant social stress, losing her job soon, living alone,  PHYSICAL EXAM:   Vitals:   10/18/21 0811  BP: 127/89  Pulse: 78  Weight: 247 lb (112 kg)  Height: 5\' 7"  (1.702 m)   Not recorded     Body mass index is 38.69 kg/m.  PHYSICAL EXAMNIATION:  Gen: NAD, conversant, well nourised, well groomed                      Cardiovascular: Regular rate rhythm, no peripheral edema, warm, nontender. Eyes: Conjunctivae clear without exudates or hemorrhage Neck: Supple, no carotid bruits. Pulmonary: Clear to auscultation bilaterally   NEUROLOGICAL EXAM:  MENTAL STATUS: Speech/cognition: Awake, alert, oriented to history taking and casual conversation CRANIAL NERVES: CN II: Visual fields are full to confrontation. Pupils are round equal and briskly reactive to light. CN III, IV, VI: extraocular movement are normal. No ptosis. CN V: Facial sensation is intact to light touch CN VII: Face is symmetric with normal eye closure  CN VIII: Hearing is normal to causal conversation. CN IX, X: Phonation is normal. CN XI: Head turning and shoulder shrug are intact  MOTOR: There is no pronator drift of out-stretched arms. Muscle bulk and tone are normal. Muscle strength is normal.  REFLEXES: Reflexes are 2+ and symmetric at the biceps, triceps, knees, and ankles. Plantar responses are flexor.  SENSORY: Intact to light touch, pinprick and vibratory sensation are intact in fingers and toes.  COORDINATION: There is no trunk or limb dysmetria noted.  GAIT/STANCE: Posture is normal. Gait is steady with normal steps, base, arm swing, and turning. Heel and toe walking are normal. Tandem gait is normal.  Romberg is absent.  REVIEW OF SYSTEMS:  Full 14 system review of systems performed and notable only for as above All other review of systems were negative.   ALLERGIES: No Known Allergies  HOME MEDICATIONS: Current Outpatient Medications  Medication Sig Dispense Refill   acetaminophen (TYLENOL) 500 MG tablet Take 2 tablets (1,000  mg total) by mouth every 8 (eight) hours as needed. For pain 100 tablet 2   No current facility-administered medications for this visit.    PAST MEDICAL HISTORY: Past Medical History:  Diagnosis Date   Seasonal allergies     PAST SURGICAL HISTORY: Past Surgical History:   Procedure Laterality Date   tubes in ears      FAMILY HISTORY: Family History  Problem Relation Age of Onset   Hypertension Maternal Grandmother     SOCIAL HISTORY: Social History   Socioeconomic History   Marital status: Single    Spouse name: Not on file   Number of children: Not on file   Years of education: Not on file   Highest education level: Not on file  Occupational History   Not on file  Tobacco Use   Smoking status: Never   Smokeless tobacco: Never  Vaping Use   Vaping Use: Never used  Substance and Sexual Activity   Alcohol use: Not Currently   Drug use: Not Currently   Sexual activity: Not on file  Other Topics Concern   Not on file  Social History Narrative   Not on file   Social Determinants of Health   Financial Resource Strain: Not on file  Food Insecurity: Not on file  Transportation Needs: Not on file  Physical Activity: Not on file  Stress: Not on file  Social Connections: Not on file  Intimate Partner Violence: Not on file      Levert Feinstein, M.D. Ph.D.  [facility] [address], [address] [place], [place] [address] Ph: [phone] Fax: [phone]  CC:  Melene Plan, DO [address] [place],  [place] [address]  Pcp, No

## 2021-10-18 NOTE — ED Notes (Signed)
Pt A&O x 4, sleeping at present, no distress noted, contracts for safety.  Monitoring for safety.

## 2021-10-19 LAB — PROLACTIN: Prolactin: 16.5 ng/mL (ref 4.8–23.3)

## 2021-10-19 MED ORDER — SERTRALINE HCL 50 MG PO TABS: 50.0000 mg | ORAL_TABLET | Freq: Every day | ORAL | 0 refills | Status: DC

## 2021-10-19 MED ORDER — ONDANSETRON 4 MG PO TBDP
4.0000 mg | ORAL_TABLET | Freq: Once | ORAL | Status: AC
  Administered 2021-10-19: 4 mg via ORAL
  Filled 2021-10-19: qty 1

## 2021-10-19 NOTE — ED Notes (Signed)
Pt sleeping at present, no distress noted.  Monitoring for safety. 

## 2021-10-19 NOTE — Discharge Instructions (Addendum)

## 2021-10-19 NOTE — ED Notes (Signed)
Informed Pt that her father was coming to pick her up and that her linen needed to be removed from her pull out bed/chair prior to departure. Pt is talking to other patients on the unit and to staff. No s&s of distress observed. Accepted scheduled morning meds. Safety maintained and will continue to monitor.
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
## 2021-10-19 NOTE — ED Notes (Signed)
Discharge instructions provided and Pt stated understanding. Pt alert, orient and ambulatory prior to d/c from facility. Personal belongings returned from locker number 31. Pt allowed to change into her personal clothes in the locker room. Pt escorted to the front lobby to meet her father for transportation home. Safety maintained.

## 2021-10-19 NOTE — ED Notes (Signed)
Patient is "seeing colors on the floor" - she asked if any med she took could cause that - looked back in [facility] and did not see a med that would cause that - secure message sent to provider informing her that patient was complaining of seeing colors on the floor

## 2021-10-19 NOTE — ED Notes (Signed)
Patient with some nausea - provider notified and one time dose zofran administered.

## 2021-11-15 ENCOUNTER — Telehealth (HOSPITAL_COMMUNITY): Payer: Self-pay | Admitting: *Deleted

## 2021-11-15 ENCOUNTER — Other Ambulatory Visit (HOSPITAL_COMMUNITY): Payer: Self-pay | Admitting: Psychiatry

## 2021-11-15 MED ORDER — SERTRALINE HCL 50 MG PO TABS
50.0000 mg | ORAL_TABLET | Freq: Every day | ORAL | 3 refills | Status: DC
Start: 2021-11-15 — End: 2022-05-31

## 2021-11-15 NOTE — Telephone Encounter (Signed)
This patient is unknown to me. Please have patient schedule an appointment.

## 2021-11-15 NOTE — Telephone Encounter (Signed)
Rx Refill Request  --sertraline (ZOLOFT) 50 MG tablet

## 2021-11-15 NOTE — Telephone Encounter (Signed)
Medication refilled and sent to pharmacy. Patient will need appointment for further refills

## 2022-05-31 ENCOUNTER — Ambulatory Visit (HOSPITAL_COMMUNITY)
Admission: EM | Admit: 2022-05-31 | Discharge: 2022-05-31 | Disposition: A | Discharge status: Home / Self Care | Payer: Medicaid Other | Attending: Nurse Practitioner | Admitting: Nurse Practitioner

## 2022-05-31 ENCOUNTER — Encounter (HOSPITAL_COMMUNITY): Payer: Self-pay | Admitting: Emergency Medicine

## 2022-05-31 DIAGNOSIS — R079 Chest pain, unspecified: Secondary | ICD-10-CM

## 2022-05-31 MED ORDER — ALUM & MAG HYDROXIDE-SIMETH 200-200-20 MG/5ML PO SUSP
30.0000 mL | Freq: Once | ORAL | Status: AC
  Administered 2022-05-31: 30 mL via ORAL

## 2022-05-31 MED ORDER — DICYCLOMINE HCL 10 MG/5ML PO SOLN: 10.0000 mg | Freq: Once | ORAL | Status: DC

## 2022-05-31 MED ORDER — LIDOCAINE VISCOUS HCL 2 % MT SOLN
15.0000 mL | Freq: Once | OROMUCOSAL | Status: AC
  Administered 2022-05-31: 15 mL via OROMUCOSAL

## 2022-05-31 MED ORDER — LIDOCAINE VISCOUS HCL 2 % MT SOLN
OROMUCOSAL | Status: AC
  Filled 2022-05-31: qty 15

## 2022-05-31 MED ORDER — ALUM & MAG HYDROXIDE-SIMETH 200-200-20 MG/5ML PO SUSP
ORAL | Status: AC
  Filled 2022-05-31: qty 30

## 2022-05-31 NOTE — ED Triage Notes (Signed)
Pt c/o generalized chest pains and body pains for months. Reports has cyst on central chest. Pt reports that pain is worse when takes deep breaths or moves.  Denies taking medications for pain

## 2022-05-31 NOTE — Discharge Instructions (Addendum)
Your EKG has unchanged when compared to 2023. You were treated with a GI Cocktail to see if this would be effective for your pain. You may consider treating gastroesophageal reflux symptoms.  Please see attached. Recommend follow up with primary and get a referral to cardiology (heart specialist) if your symptoms persistent

## 2022-05-31 NOTE — ED Provider Notes (Signed)
[place]    CSN: [id number] Arrival date & time: 05/31/22  1418      History   Chief Complaint Chief Complaint  Patient presents with   Chest Pain   Generalized Body Aches    HPI Anita Mcbride is a 22 y.o. female.   HPI She is in today for several months of chest pain. She reports that it is getting worse. She feels like the pain affects her breathing.  She feels like it is a bandlike pain under her breast that radiates to the back and down the abdomen. Denies fever, chills, headache, dizziness, visual changes, shortness of breath, dyspnea on exertion, , nausea, vomiting,  constipation, diarrhea, or any edema. She has not treated it with any medication.   Past Medical History:  Diagnosis Date   Seasonal allergies     Patient Active Problem List   Diagnosis Date Noted   Paresthesia 10/18/2021   Weakness 10/18/2021   Leg pain, bilateral 12/31/2020   Abnormal ECG 06/10/2016   Musculoskeletal chest pain 06/10/2016    Past Surgical History:  Procedure Laterality Date   tubes in ears      OB History   No obstetric history on file.      Home Medications    Prior to Admission medications   Not on File    Family History Family History  Problem Relation Age of Onset   Hypertension Maternal Grandmother     Social History Social History   Tobacco Use   Smoking status: Never   Smokeless tobacco: Never  Vaping Use   Vaping Use: Never used  Substance Use Topics   Alcohol use: Not Currently   Drug use: Not Currently     Allergies   Patient has no known allergies.   Review of Systems Review of Systems   Physical Exam Triage Vital Signs ED Triage Vitals  Enc Vitals Group     BP 05/31/22 1513 118/79     Pulse Rate 05/31/22 1513 89     Resp 05/31/22 1513 13     Temp 05/31/22 1513 98.7 F (37.1 C)     Temp Source 05/31/22 1513 Oral     SpO2 05/31/22 1513 98 %     Weight --      Height --      Head Circumference --      Peak  Flow --      Pain Score 05/31/22 1511 6     Pain Loc --      Pain Edu? --      Excl. in [place]? --    No data found.  Updated Vital Signs BP 118/79 (BP Location: Right Arm)   Pulse 89   Temp 98.7 F (37.1 C) (Oral)   Resp 13   LMP 05/04/2022   SpO2 98%   Visual Acuity Right Eye Distance:   Left Eye Distance:   Bilateral Distance:    Right Eye Near:   Left Eye Near:    Bilateral Near:     Physical Exam Constitutional:      Appearance: She is obese.  HENT:     Head: Normocephalic.  Eyes:     Pupils: Pupils are equal, round, and reactive to light.  Cardiovascular:     Rate and Rhythm: Normal rate and regular rhythm.     Heart sounds: Normal heart sounds. Heart sounds not distant. No murmur heard. Pulmonary:     Effort: Pulmonary effort is normal.  Breath sounds: Normal breath sounds. No decreased breath sounds, wheezing, rhonchi or rales.  Chest:     Chest wall: No mass.  Abdominal:     Palpations: Abdomen is soft.     Comments: Hypoactive Murphy sign negative  Musculoskeletal:        General: Normal range of motion.     Cervical back: Normal range of motion and neck supple.  Skin:    General: Skin is warm and dry.     Capillary Refill: Capillary refill takes less than 2 seconds.  Neurological:     General: No focal deficit present.     Mental Status: She is alert.      UC Treatments / Results  Labs (all labs ordered are listed, but only abnormal results are displayed) Labs Reviewed - No data to display  EKG   Radiology No results found.  Procedures Procedures (including critical care time)  Medications Ordered in UC Medications  alum & mag hydroxide-simeth (MAALOX/MYLANTA) 200-200-20 MG/5ML suspension 30 mL (30 mLs Oral Given 05/31/22 1547)  lidocaine (XYLOCAINE) 2 % viscous mouth solution 15 mL (15 mLs Mouth/Throat Given 05/31/22 1547)    Initial Impression / Assessment and Plan / UC Course  I have reviewed the triage vital signs and the  nursing notes.  Pertinent labs & imaging results that were available during my care of the patient were reviewed by me and considered in my medical decision making (see chart for details).     Chest pain Final Clinical Impressions(s) / UC Diagnoses   Final diagnoses:  Chest pain, unspecified type  She reports that the GI cocktail was effective for helping with her pain.    Discharge Instructions      Your EKG has unchanged when compared to 2023. You were treated with a GI Cocktail to see if this would be effective for your pain.  Recommend follow up with primary and get a referral to cardiology (heart specialist) if your symptoms persistent      ED Prescriptions   None    PDMP not reviewed this encounter.   Anita Mcbride [place], [place] 05/31/22 [phone]

## 2022-09-02 ENCOUNTER — Emergency Department (HOSPITAL_COMMUNITY)
Admission: EM | Admit: 2022-09-02 | Discharge: 2022-09-02 | Disposition: A | Discharge status: Home / Self Care | Payer: Medicaid Other | Attending: Emergency Medicine | Admitting: Emergency Medicine

## 2022-09-02 ENCOUNTER — Other Ambulatory Visit: Payer: Self-pay

## 2022-09-02 ENCOUNTER — Encounter (HOSPITAL_COMMUNITY): Payer: Self-pay

## 2022-09-02 DIAGNOSIS — R1030 Lower abdominal pain, unspecified: Secondary | ICD-10-CM | POA: Diagnosis not present

## 2022-09-02 DIAGNOSIS — M79606 Pain in leg, unspecified: Secondary | ICD-10-CM | POA: Diagnosis not present

## 2022-09-02 DIAGNOSIS — G8929 Other chronic pain: Secondary | ICD-10-CM | POA: Diagnosis not present

## 2022-09-02 DIAGNOSIS — O99891 Other specified diseases and conditions complicating pregnancy: Secondary | ICD-10-CM | POA: Insufficient documentation

## 2022-09-02 DIAGNOSIS — M79605 Pain in left leg: Secondary | ICD-10-CM | POA: Diagnosis not present

## 2022-09-02 DIAGNOSIS — Z3490 Encounter for supervision of normal pregnancy, unspecified, unspecified trimester: Secondary | ICD-10-CM

## 2022-09-02 DIAGNOSIS — Z3A01 Less than 8 weeks gestation of pregnancy: Secondary | ICD-10-CM | POA: Diagnosis not present

## 2022-09-02 DIAGNOSIS — O26891 Other specified pregnancy related conditions, first trimester: Secondary | ICD-10-CM | POA: Diagnosis not present

## 2022-09-02 DIAGNOSIS — I10 Essential (primary) hypertension: Secondary | ICD-10-CM | POA: Diagnosis not present

## 2022-09-02 LAB — CBC WITH DIFFERENTIAL/PLATELET
Abs Immature Granulocytes: 0.07 10*3/uL (ref 0.00–0.07)
Basophils Absolute: 0 10*3/uL (ref 0.0–0.1)
Basophils Relative: 0 %
Eosinophils Absolute: 0.1 10*3/uL (ref 0.0–0.5)
Eosinophils Relative: 2 %
HCT: 34.2 % — ABNORMAL LOW (ref 36.0–46.0)
Hemoglobin: 10.7 g/dL — ABNORMAL LOW (ref 12.0–15.0)
Immature Granulocytes: 1 %
Lymphocytes Relative: 17 %
Lymphs Abs: 1.7 10*3/uL (ref 0.7–4.0)
MCH: 24.8 pg — ABNORMAL LOW (ref 26.0–34.0)
MCHC: 31.3 g/dL (ref 30.0–36.0)
MCV: 79.4 fL — ABNORMAL LOW (ref 80.0–100.0)
Monocytes Absolute: 0.7 10*3/uL (ref 0.1–1.0)
Monocytes Relative: 7 %
Neutro Abs: 7 10*3/uL (ref 1.7–7.7)
Neutrophils Relative %: 73 %
Platelets: 326 10*3/uL (ref 150–400)
RBC: 4.31 MIL/uL (ref 3.87–5.11)
RDW: 14.8 % (ref 11.5–15.5)
WBC: 9.6 10*3/uL (ref 4.0–10.5)
nRBC: 0 % (ref 0.0–0.2)

## 2022-09-02 LAB — URINALYSIS, ROUTINE W REFLEX MICROSCOPIC
Bacteria, UA: NONE SEEN
Bilirubin Urine: NEGATIVE
Glucose, UA: NEGATIVE mg/dL
Hgb urine dipstick: NEGATIVE
Ketones, ur: 5 mg/dL — AB
Nitrite: NEGATIVE
Protein, ur: NEGATIVE mg/dL
Specific Gravity, Urine: 1.032 — ABNORMAL HIGH (ref 1.005–1.030)
pH: 5 (ref 5.0–8.0)

## 2022-09-02 LAB — COMPREHENSIVE METABOLIC PANEL
ALT: 25 U/L (ref 0–44)
AST: 19 U/L (ref 15–41)
Albumin: 3.7 g/dL (ref 3.5–5.0)
Alkaline Phosphatase: 45 U/L (ref 38–126)
Anion gap: 7 (ref 5–15)
BUN: 9 mg/dL (ref 6–20)
CO2: 21 mmol/L — ABNORMAL LOW (ref 22–32)
Calcium: 9 mg/dL (ref 8.9–10.3)
Chloride: 106 mmol/L (ref 98–111)
Creatinine, Ser: 0.65 mg/dL (ref 0.44–1.00)
GFR, Estimated: >60 mL/min (ref >60)
Glucose, Bld: 87 mg/dL (ref 70–99)
Potassium: 3.4 mmol/L — ABNORMAL LOW (ref 3.5–5.1)
Sodium: 134 mmol/L — ABNORMAL LOW (ref 135–145)
Total Bilirubin: 0.7 mg/dL (ref 0.3–1.2)
Total Protein: 7.2 g/dL (ref 6.5–8.1)

## 2022-09-02 LAB — PREGNANCY, URINE: Preg Test, Ur: POSITIVE — AB

## 2022-09-02 MED ORDER — KETOROLAC TROMETHAMINE 15 MG/ML IJ SOLN
15.0000 mg | Freq: Once | INTRAMUSCULAR | Status: AC
  Administered 2022-09-02: 15 mg via INTRAVENOUS
  Filled 2022-09-02: qty 1

## 2022-09-02 NOTE — ED Triage Notes (Signed)
BIB EMS from work for nerve pain in legs that has progressively gotten worse. Pt states she was having abdominal pain and then it stopped and went into her legs Recently diagnosed with nerve damage.

## 2022-09-02 NOTE — ED Provider Notes (Signed)
[place] EMERGENCY DEPARTMENT AT [facility] Provider Note   CSN: [id number] Arrival date & time: 09/02/22  1111     History  Chief Complaint  Patient presents with   Leg Pain    Anita Mcbride is a 22 y.o. female.  22 year old female with prior medical history as detailed below presents for evaluation.  Patient presents from work with EMS transport.  Patient reports that she was at work when she started to have some nausea.  Patient reports that she has had intermittent problems with " nerve pain" in both of her thighs.  She reports that while at work today after the onset of nausea she felt like her leg pain was worse. Nausea is now resolved.   She did not take anything at work for her symptoms.  She called EMS for transport.  She is presenting today requesting a pregnancy test.  She believes her last period was early last month. She reports that she thinks she is due for her next period this week.  She does not use birth control. Her last sexual intercourse was this morning. She denies abdominal pain, vaginal bleeding, fever, etc.   She is also requesting a work note.  Notably, patient is able to produce a photo of prior ED evaluation at [facility] facility with diagnosis of "meralgia paresthetica."  In the past she has used naproxen intermittently for treatment of her leg pain.  She did not take any today.  The history is provided by the patient and medical records.       Home Medications Prior to Admission medications   Not on File      Allergies    Patient has no known allergies.    Review of Systems   Review of Systems  All other systems reviewed and are negative.   Physical Exam Updated Vital Signs BP 124/84   Pulse 91   Temp 97.8 F (36.6 C) (Oral)   Resp 16   Ht 5\' 7"  (1.702 m)   Wt 112 kg   SpO2 100%   BMI 38.67 kg/m  Physical Exam Vitals and nursing note reviewed.  Constitutional:      General: She is not in acute distress.     Appearance: Normal appearance. She is well-developed.  HENT:     Head: Normocephalic and atraumatic.  Eyes:     Conjunctiva/sclera: Conjunctivae normal.     Pupils: Pupils are equal, round, and reactive to light.  Cardiovascular:     Rate and Rhythm: Normal rate and regular rhythm.     Heart sounds: Normal heart sounds.  Pulmonary:     Effort: Pulmonary effort is normal. No respiratory distress.     Breath sounds: Normal breath sounds.  Abdominal:     General: There is no distension.     Palpations: Abdomen is soft.     Tenderness: There is no abdominal tenderness.  Musculoskeletal:        General: No deformity. Normal range of motion.     Cervical back: Normal range of motion and neck supple.  Skin:    General: Skin is warm and dry.  Neurological:     General: No focal deficit present.     Mental Status: She is alert and oriented to person, place, and time.     ED Results / Procedures / Treatments   Labs (all labs ordered are listed, but only abnormal results are displayed) Labs Reviewed  CBC WITH DIFFERENTIAL/PLATELET - Abnormal; Notable for the  following components:      Result Value   Hemoglobin 10.7 (*)    HCT 34.2 (*)    MCV 79.4 (*)    MCH 24.8 (*)    All other components within normal limits  COMPREHENSIVE METABOLIC PANEL - Abnormal; Notable for the following components:   Sodium 134 (*)    Potassium 3.4 (*)    CO2 21 (*)    All other components within normal limits  URINALYSIS, ROUTINE W REFLEX MICROSCOPIC - Abnormal; Notable for the following components:   Specific Gravity, Urine 1.032 (*)    Ketones, ur 5 (*)    Leukocytes,Ua TRACE (*)    All other components within normal limits  PREGNANCY, URINE - Abnormal; Notable for the following components:   Preg Test, Ur POSITIVE (*)    All other components within normal limits    EKG None  Radiology No results found.  Procedures Procedures    Medications Ordered in ED Medications  ketorolac  (TORADOL) 15 MG/ML injection 15 mg (has no administration in time range)    ED Course/ Medical Decision Making/ A&P                             Medical Decision Making Amount and/or Complexity of Data Reviewed Labs: ordered.  Risk Prescription drug management.    Medical Screen Complete  This patient presented to the ED with complaint of nausea, leg pain.  This complaint involves an extensive number of treatment options. The initial differential diagnosis includes, but is not limited to, metabolic abnormality, etc.  This presentation is: Acute, Self-Limited, Previously Undiagnosed, Uncertain Prognosis, Complicated, Systemic Symptoms, and Threat to Life/Bodily Function   Patient presents with request for pregnancy test.  With additional questioning it appears that her last period was approximately 4 to 6 weeks ago.  Patient denies abdominal pain, vaginal bleeding, vaginal discharge, other GYN complaint.  Patient complains of tingling discomfort to the thighs.  This appears to be a chronic condition.  Screening labs obtained are without significant abnormality other than positive pregnancy test.  Based on obtained history patient's pregnancy is very early.  She is without symptoms suggestive of need for additional emergent evaluation.  Patient informed of and educated about early pregnancy.  Patient is informed and advised that she should follow-up closely with GYN.  Patient was not planning on becoming pregnant.  She reports that she may not want to keep this pregnancy.  Patient advised that close early follow-up with GYN is important.  Strict return precautions given and understood.  Ambulatory referral made for GYN.  Patient provided with contacts for GYN clinic.   Additional history obtained: External records from outside sources obtained and reviewed including prior ED visits and prior Inpatient records.    Lab Tests:  I ordered and personally interpreted labs.     Problem List / ED Course:  Pregnancy   Reevaluation:  After the interventions noted above, I reevaluated the patient and found that they have: stayed the same   Disposition:  After consideration of the diagnostic results and the patients response to treatment, I feel that the patent would benefit from close outpatient followup.          Final Clinical Impression(s) / ED Diagnoses Final diagnoses:  Pregnancy, unspecified gestational age    Rx / DC Orders ED Discharge Orders     None         Estefania Kamiya, Noralyn Pick, MD  09/02/22 1333  

## 2022-09-02 NOTE — Discharge Instructions (Addendum)
Return for any problem.  Your pregnancy test today was positive.  This implies that you are newly  pregnant.  It is imperative for you to find and follow-up with GYN.  If you decide that this pregnancy is not desired options are available to you.  The earlier follow-up with GYN in the sooner these options are still a possibility.  Regardless, early appropriate prenatal care is very important.  If you develop abdominal pain, fever, vaginal bleeding you should seek medical attention.

## 2022-09-02 NOTE — ED Notes (Signed)
Pt ambulatory to bathroom

## 2023-02-24 ENCOUNTER — Other Ambulatory Visit: Payer: Self-pay

## 2023-02-24 ENCOUNTER — Encounter (HOSPITAL_COMMUNITY): Payer: Self-pay

## 2023-02-24 ENCOUNTER — Emergency Department (HOSPITAL_COMMUNITY)
Admission: EM | Admit: 2023-02-24 | Discharge: 2023-02-24 | Disposition: A | Discharge status: Home / Self Care | Payer: Medicaid Other | Attending: Student | Admitting: Student

## 2023-02-24 DIAGNOSIS — Z1152 Encounter for screening for COVID-19: Secondary | ICD-10-CM | POA: Insufficient documentation

## 2023-02-24 DIAGNOSIS — J020 Streptococcal pharyngitis: Secondary | ICD-10-CM | POA: Diagnosis not present

## 2023-02-24 DIAGNOSIS — J029 Acute pharyngitis, unspecified: Secondary | ICD-10-CM | POA: Diagnosis present

## 2023-02-24 LAB — RESP PANEL BY RT-PCR (RSV, FLU A&B, COVID)  RVPGX2
Influenza A by PCR: NEGATIVE
Influenza B by PCR: NEGATIVE
Resp Syncytial Virus by PCR: NEGATIVE
SARS Coronavirus 2 by RT PCR: NEGATIVE

## 2023-02-24 LAB — GROUP A STREP BY PCR: Group A Strep by PCR: DETECTED — AB

## 2023-02-24 MED ORDER — ONDANSETRON 4 MG PO TBDP: 4.0000 mg | ORAL_TABLET | Freq: Three times a day (TID) | ORAL | 0 refills | Status: DC | PRN

## 2023-02-24 MED ORDER — ONDANSETRON HCL 4 MG PO TABS: 4.0000 mg | ORAL_TABLET | Freq: Three times a day (TID) | ORAL | Status: DC | PRN

## 2023-02-24 MED ORDER — PENICILLIN G BENZATHINE 1200000 UNIT/2ML IM SUSY
1.2000 10*6.[IU] | PREFILLED_SYRINGE | Freq: Once | INTRAMUSCULAR | Status: AC
  Administered 2023-02-24: 1.2 10*6.[IU] via INTRAMUSCULAR
  Filled 2023-02-24: qty 2

## 2023-02-24 MED ORDER — ONDANSETRON 4 MG PO TBDP
4.0000 mg | ORAL_TABLET | Freq: Once | ORAL | Status: AC
  Administered 2023-02-24: 4 mg via ORAL
  Filled 2023-02-24: qty 1

## 2023-02-24 NOTE — ED Triage Notes (Signed)
C/o generalized abd pain, sore throat, n/v, headache x5 days.

## 2023-02-25 NOTE — ED Provider Notes (Signed)
Anita Mcbride Provider Note  CSN: [id number] Arrival date & time: 02/24/23 2047  Chief Complaint(s) Sore Throat  HPI Anita Mcbride is a 22 y.o. female who presents emergency room for evaluation of generalized abdominal pain, nausea, vomiting, headache and sore throat for the last 5 days.  Works as a Electrical engineer on a college campus.  Denies chest pain, shortness of breath, fever or other systemic symptoms.   Past Medical History Past Medical History:  Diagnosis Date   Seasonal allergies    Patient Active Problem List   Diagnosis Date Noted   Paresthesia 10/18/2021   Weakness 10/18/2021   Leg pain, bilateral 12/31/2020   Abnormal ECG 06/10/2016   Musculoskeletal chest pain 06/10/2016   Home Medication(s) Prior to Admission medications   Medication Sig Start Date End Date Taking? Authorizing Provider  acetaminophen (TYLENOL) 500 MG tablet Take 1,000 mg by mouth every 6 (six) hours as needed for moderate pain.    [provider]  cetirizine (ZYRTEC) 10 MG tablet Take 10 mg by mouth daily as needed for allergies.    [provider]  ibuprofen (ADVIL) 200 MG tablet Take 200 mg by mouth every 6 (six) hours as needed for moderate pain.    [provider]  loratadine (CLARITIN) 10 MG tablet Take 10 mg by mouth daily as needed for allergies.    [provider]  ondansetron (ZOFRAN-ODT) 4 MG disintegrating tablet Take 1 tablet (4 mg total) by mouth every 8 (eight) hours as needed for nausea or vomiting. 02/24/23   Ashvin Adelson, Wyn Forster, MD                                                                                                                                    Past Surgical History Past Surgical History:  Procedure Laterality Date   tubes in ears     Family History Family History  Problem Relation Age of Onset   Hypertension Maternal Grandmother     Social History Social History   Tobacco Use    Smoking status: Never   Smokeless tobacco: Never  Vaping Use   Vaping status: Never Used  Substance Use Topics   Alcohol use: Not Currently   Drug use: Not Currently   Allergies Patient has no known allergies.  Review of Systems Review of Systems  HENT:  Positive for sore throat.   Gastrointestinal:  Positive for nausea and vomiting.    Physical Exam Vital Signs  I have reviewed the triage vital signs BP (!) 134/98   Pulse 100   Temp 99.9 F (37.7 C) (Oral)   Resp 18   Wt 112 kg   SpO2 100%   BMI 38.67 kg/m   Physical Exam Vitals and nursing note reviewed.  Constitutional:      General: She is not in acute distress.    Appearance: She is well-developed.  HENT:  Head: Normocephalic and atraumatic.     Mouth/Throat:     Pharynx: Posterior oropharyngeal erythema present.     Tonsils: Tonsillar exudate present.  Eyes:     Conjunctiva/sclera: Conjunctivae normal.  Cardiovascular:     Rate and Rhythm: Normal rate and regular rhythm.     Heart sounds: No murmur heard. Pulmonary:     Effort: Pulmonary effort is normal. No respiratory distress.     Breath sounds: Normal breath sounds.  Abdominal:     Palpations: Abdomen is soft.     Tenderness: There is no abdominal tenderness.  Musculoskeletal:        General: No swelling.     Cervical back: Neck supple.  Skin:    General: Skin is warm and dry.     Capillary Refill: Capillary refill takes less than 2 seconds.  Neurological:     Mental Status: She is alert.  Psychiatric:        Mood and Affect: Mood normal.     ED Results and Treatments Labs (all labs ordered are listed, but only abnormal results are displayed) Labs Reviewed  GROUP A STREP BY PCR - Abnormal; Notable for the following components:      Result Value   Group A Strep by PCR DETECTED (*)    All other components within normal limits  RESP PANEL BY RT-PCR (RSV, FLU A&B, COVID)  RVPGX2                                                                                                                           Radiology No results found.  Pertinent labs & imaging results that were available during my care of the patient were reviewed by me and considered in my medical decision making (see MDM for details).  Medications Ordered in ED Medications  penicillin g benzathine (BICILLIN LA) 1200000 UNIT/2ML injection 1.2 Million Units (1.2 Million Units Intramuscular Given 02/24/23 2215)  ondansetron (ZOFRAN-ODT) disintegrating tablet 4 mg (4 mg Oral Given 02/24/23 2213)                                                                                                                                     Procedures Procedures  (including critical care time)  Medical Decision Making / ED Course   This patient presents to the ED for concern of sore throat, abdominal pain, nausea, vomiting, this involves an  extensive number of treatment options, and is a complaint that carries with it a high risk of complications and morbidity.  The differential diagnosis includes strep pharyngitis, unspecified viral URI, COVID, flu, RSV, gastroenteritis, pancreatitis, gastritis  MDM: Patient seen emergency room for evaluation of multiple complaints described above.  Physical exam with tonsillar swelling, exudate and oropharyngeal erythema.  Physical exam otherwise unremarkable.  Patient strep positive and was treated with Bicillin.  COVID, flu, RSV negative.  Patient Anita Mcbride consistent with strep pharyngitis and at this time she does not meet inpatient criteria for admission.  Will be discharged with Zofran for symptomatic control.  Patient discharged with return precautions of which she voiced understanding   Additional history obtained:  -External records from outside source obtained and reviewed including: Chart review including previous notes, labs, imaging, consultation notes   Lab Tests: -I ordered, reviewed, and interpreted labs.   The pertinent  results include:   Labs Reviewed  GROUP A STREP BY PCR - Abnormal; Notable for the following components:      Result Value   Group A Strep by PCR DETECTED (*)    All other components within normal limits  RESP PANEL BY RT-PCR (RSV, FLU A&B, COVID)  RVPGX2     Medicines ordered and prescription drug management: Meds ordered this encounter  Medications   penicillin g benzathine (BICILLIN LA) 1200000 UNIT/2ML injection 1.2 Million Units    Order Specific Question:   Antibiotic Indication:    Answer:   Pharyngitis   ondansetron (ZOFRAN-ODT) disintegrating tablet 4 mg   DISCONTD: ondansetron (ZOFRAN) 4 MG tablet    Sig: Take 1 tablet (4 mg total) by mouth every 8 (eight) hours as needed for nausea or vomiting.   DISCONTD: ondansetron (ZOFRAN-ODT) 4 MG disintegrating tablet    Sig: Take 1 tablet (4 mg total) by mouth every 8 (eight) hours as needed for nausea or vomiting.    Dispense:  20 tablet    Refill:  0   ondansetron (ZOFRAN-ODT) 4 MG disintegrating tablet    Sig: Take 1 tablet (4 mg total) by mouth every 8 (eight) hours as needed for nausea or vomiting.    Dispense:  20 tablet    Refill:  0    -I have reviewed the patients home medicines and have made adjustments as needed  Critical interventions none    Cardiac Monitoring: The patient was maintained on a cardiac monitor.  I personally viewed and interpreted the cardiac monitored which showed an underlying rhythm of: NSR  Social Determinants of Health:  Factors impacting patients care include: Works as a Electrical engineer on a college campus   Reevaluation: After the interventions noted above, I reevaluated the patient and found that they have :improved  Co morbidities that complicate the patient evaluation  Past Medical History:  Diagnosis Date   Seasonal allergies       Dispostion: I considered admission for this patient, but at this time she does not meet inpatient criteria for admission and will be discharged  with outpatient follow-up     Final Clinical Impression(s) / ED Diagnoses Final diagnoses:  Strep pharyngitis     @PCDICTATION @    Glendora Score, MD 02/25/23 [phone]

## 2023-05-07 ENCOUNTER — Emergency Department (HOSPITAL_COMMUNITY)
Admission: EM | Admit: 2023-05-07 | Discharge: 2023-05-07 | Disposition: A | Discharge status: Home / Self Care | Payer: Medicaid Other | Attending: Emergency Medicine | Admitting: Emergency Medicine

## 2023-05-07 ENCOUNTER — Emergency Department (HOSPITAL_COMMUNITY): Payer: Medicaid Other

## 2023-05-07 ENCOUNTER — Other Ambulatory Visit: Payer: Self-pay

## 2023-05-07 ENCOUNTER — Encounter (HOSPITAL_COMMUNITY): Payer: Self-pay | Admitting: *Deleted

## 2023-05-07 DIAGNOSIS — R9431 Abnormal electrocardiogram [ECG] [EKG]: Secondary | ICD-10-CM | POA: Diagnosis not present

## 2023-05-07 DIAGNOSIS — R1011 Right upper quadrant pain: Secondary | ICD-10-CM | POA: Diagnosis not present

## 2023-05-07 DIAGNOSIS — E876 Hypokalemia: Secondary | ICD-10-CM | POA: Insufficient documentation

## 2023-05-07 DIAGNOSIS — R531 Weakness: Secondary | ICD-10-CM | POA: Diagnosis not present

## 2023-05-07 DIAGNOSIS — R059 Cough, unspecified: Secondary | ICD-10-CM | POA: Diagnosis not present

## 2023-05-07 DIAGNOSIS — R197 Diarrhea, unspecified: Secondary | ICD-10-CM | POA: Insufficient documentation

## 2023-05-07 DIAGNOSIS — R112 Nausea with vomiting, unspecified: Secondary | ICD-10-CM | POA: Diagnosis not present

## 2023-05-07 DIAGNOSIS — R0981 Nasal congestion: Secondary | ICD-10-CM | POA: Diagnosis not present

## 2023-05-07 DIAGNOSIS — R109 Unspecified abdominal pain: Secondary | ICD-10-CM | POA: Diagnosis not present

## 2023-05-07 LAB — CBC
HCT: 37 % (ref 36.0–46.0)
Hemoglobin: 11.1 g/dL — ABNORMAL LOW (ref 12.0–15.0)
MCH: 23.2 pg — ABNORMAL LOW (ref 26.0–34.0)
MCHC: 30 g/dL (ref 30.0–36.0)
MCV: 77.4 fL — ABNORMAL LOW (ref 80.0–100.0)
Platelets: 350 10*3/uL (ref 150–400)
RBC: 4.78 MIL/uL (ref 3.87–5.11)
RDW: 15.2 % (ref 11.5–15.5)
WBC: 5.5 10*3/uL (ref 4.0–10.5)
nRBC: 0 % (ref 0.0–0.2)

## 2023-05-07 LAB — COMPREHENSIVE METABOLIC PANEL
ALT: 15 U/L (ref 0–44)
AST: 20 U/L (ref 15–41)
Albumin: 3.7 g/dL (ref 3.5–5.0)
Alkaline Phosphatase: 55 U/L (ref 38–126)
Anion gap: 7 (ref 5–15)
BUN: 9 mg/dL (ref 6–20)
CO2: 23 mmol/L (ref 22–32)
Calcium: 8.6 mg/dL — ABNORMAL LOW (ref 8.9–10.3)
Chloride: 106 mmol/L (ref 98–111)
Creatinine, Ser: 0.77 mg/dL (ref 0.44–1.00)
GFR, Estimated: >60 mL/min (ref >60)
Glucose, Bld: 107 mg/dL — ABNORMAL HIGH (ref 70–99)
Potassium: 3.3 mmol/L — ABNORMAL LOW (ref 3.5–5.1)
Sodium: 136 mmol/L (ref 135–145)
Total Bilirubin: 1.9 mg/dL — ABNORMAL HIGH (ref 0.0–1.2)
Total Protein: 7.6 g/dL (ref 6.5–8.1)

## 2023-05-07 LAB — URINALYSIS, ROUTINE W REFLEX MICROSCOPIC
Bilirubin Urine: NEGATIVE
Glucose, UA: NEGATIVE mg/dL
Hgb urine dipstick: NEGATIVE
Ketones, ur: NEGATIVE mg/dL
Leukocytes,Ua: NEGATIVE
Nitrite: NEGATIVE
Protein, ur: NEGATIVE mg/dL
Specific Gravity, Urine: 1.032 — ABNORMAL HIGH (ref 1.005–1.030)
pH: 5 (ref 5.0–8.0)

## 2023-05-07 LAB — LIPASE, BLOOD: Lipase: 26 U/L (ref 11–51)

## 2023-05-07 LAB — HCG, SERUM, QUALITATIVE: Preg, Serum: NEGATIVE

## 2023-05-07 MED ORDER — ONDANSETRON 4 MG PO TBDP: 4.0000 mg | ORAL_TABLET | Freq: Three times a day (TID) | ORAL | 0 refills | Status: AC | PRN

## 2023-05-07 MED ORDER — DICYCLOMINE HCL 20 MG PO TABS: 20.0000 mg | ORAL_TABLET | Freq: Two times a day (BID) | ORAL | 0 refills | Status: AC

## 2023-05-07 MED ORDER — ONDANSETRON 8 MG PO TBDP
8.0000 mg | ORAL_TABLET | Freq: Once | ORAL | Status: AC
  Administered 2023-05-07: 8 mg via ORAL

## 2023-05-07 MED ORDER — ONDANSETRON 8 MG PO TBDP
ORAL_TABLET | ORAL | Status: AC
  Filled 2023-05-07: qty 1

## 2023-05-07 NOTE — ED Provider Triage Note (Signed)
 Emergency Medicine Provider Triage Evaluation Note  Anita Mcbride , a 23 y.o. female  was evaluated in triage.  Pt complains of abdominal pain nausea vomiting for the past 2 days she states she has had some loose stool as well.  No fevers, no history of abdominal surgeries no medical problems.  No urinary symptoms.  Review of Systems  Positive: Abdominal pain nausea vomiting Negative: Fever  Physical Exam  BP (!) 157/87 (BP Location: Right Arm)   Pulse 96   Temp 98.7 F (37.1 C) (Oral)   Resp 18   Ht 5' 7 (1.702 m)   Wt 111.1 kg   LMP 04/23/2023 (Approximate)   SpO2 100%   BMI 38.37 kg/m  Gen:   Awake, no distress   Resp:  Normal effort  MSK:   Moves extremities without difficulty  Other:  Upper abd TTP, in RUQ and epigastrium.  Medical Decision Making  Medically screening exam initiated at 5:15 PM.  Appropriate orders placed.  Rika D Shelburne was informed that the remainder of the evaluation will be completed by another provider, this initial triage assessment does not replace that evaluation, and the importance of remaining in the ED until their evaluation is complete.  Labs, US  of RUQ   Neldon Hamp RAMAN, [place] 05/07/23 [phone]

## 2023-05-07 NOTE — ED Triage Notes (Signed)
 Here by POV from home for NVD, abd/ epigastric pain, and general weakness. Multiple family members with similar sx. Denies fever, bleeding, recent antibiotics or travel. Alert, NAD, calm, steady gait. No meds PTA.

## 2023-05-07 NOTE — ED Provider Notes (Signed)
 [place] EMERGENCY DEPARTMENT AT [facility] Provider Note   CSN: [id number] Arrival date & time: 05/07/23  1503     History  Chief Complaint  Patient presents with   Diarrhea    Anita Mcbride is a 23 y.o. female.   Diarrhea Patient is a 23 year old female with past medical history significant for seasonal allergies, paresthesias, weakness, MSK chest pain  Patient presents emergency room today with complaints of 2 days of nausea vomiting abdominal pain and loose stool also some cough congestion fatigue.  She denies any chest pain or shortness of breath currently.  Denies any fevers at home.  No unilateral or bilateral leg swelling no hemoptysis  She states she has felt overall more weak than usual.  No recent antibiotics, travel, no history of sick contacts.     Home Medications Prior to Admission medications   Medication Sig Start Date End Date Taking? Authorizing Provider  dicyclomine  (BENTYL ) 20 MG tablet Take 1 tablet (20 mg total) by mouth 2 (two) times daily. 05/07/23  Yes Yoel Kaufhold S, PA  ondansetron  (ZOFRAN -ODT) 4 MG disintegrating tablet Take 1 tablet (4 mg total) by mouth every 8 (eight) hours as needed for nausea or vomiting. 05/07/23  Yes Elery Cadenhead S, PA  acetaminophen  (TYLENOL ) 500 MG tablet Take 1,000 mg by mouth every 6 (six) hours as needed for moderate pain.    [provider]  cetirizine (ZYRTEC) 10 MG tablet Take 10 mg by mouth daily as needed for allergies.    [provider]  ibuprofen  (ADVIL ) 200 MG tablet Take 200 mg by mouth every 6 (six) hours as needed for moderate pain.    [provider]  loratadine (CLARITIN) 10 MG tablet Take 10 mg by mouth daily as needed for allergies.    [provider]      Allergies    Patient has no known allergies.    Review of Systems   Review of Systems  Gastrointestinal:  Positive for diarrhea.    Physical Exam Updated Vital Signs BP (!) 145/80   Pulse 80    Temp 98.7 F (37.1 C) (Oral)   Resp 18   Ht 5' 7 (1.702 m)   Wt 111.1 kg   LMP 04/23/2023 (Approximate)   SpO2 100%   BMI 38.37 kg/m  Physical Exam Vitals and nursing note reviewed.  Constitutional:      General: She is not in acute distress. HENT:     Head: Normocephalic and atraumatic.     Nose: Nose normal.  Eyes:     General: No scleral icterus. Cardiovascular:     Rate and Rhythm: Normal rate and regular rhythm.     Pulses: Normal pulses.     Heart sounds: Normal heart sounds.  Pulmonary:     Effort: Pulmonary effort is normal. No respiratory distress.     Breath sounds: No wheezing.  Abdominal:     Palpations: Abdomen is soft.     Tenderness: There is no abdominal tenderness.  Musculoskeletal:     Cervical back: Normal range of motion.     Right lower leg: No edema.     Left lower leg: No edema.  Skin:    General: Skin is warm and dry.     Capillary Refill: Capillary refill takes less than 2 seconds.  Neurological:     Mental Status: She is alert. Mental status is at baseline.  Psychiatric:        Mood and Affect: Mood  normal.        Behavior: Behavior normal.     ED Results / Procedures / Treatments   Labs (all labs ordered are listed, but only abnormal results are displayed) Labs Reviewed  COMPREHENSIVE METABOLIC PANEL - Abnormal; Notable for the following components:      Result Value   Potassium 3.3 (*)    Glucose, Bld 107 (*)    Calcium 8.6 (*)    Total Bilirubin 1.9 (*)    All other components within normal limits  CBC - Abnormal; Notable for the following components:   Hemoglobin 11.1 (*)    MCV 77.4 (*)    MCH 23.2 (*)    All other components within normal limits  URINALYSIS, ROUTINE W REFLEX MICROSCOPIC - Abnormal; Notable for the following components:   APPearance HAZY (*)    Specific Gravity, Urine 1.032 (*)    All other components within normal limits  LIPASE, BLOOD  HCG, SERUM, QUALITATIVE    EKG None  Radiology US  Abdomen  Limited RUQ (LIVER/GB) Result Date: 05/07/2023 CLINICAL DATA:  Right upper quadrant abdominal pain for 2 days. EXAM: ULTRASOUND ABDOMEN LIMITED RIGHT UPPER QUADRANT COMPARISON:  None Available. FINDINGS: Gallbladder: No gallstones or wall thickening visualized. No sonographic Murphy sign noted by sonographer. Common bile duct: Diameter: 3 mm which is within normal limits. Liver: No focal lesion identified. Within normal limits in parenchymal echogenicity. Portal vein is patent on color Doppler imaging with normal direction of blood flow towards the liver. Other: None. IMPRESSION: No definite abnormality seen in the right upper quadrant of the abdomen. Electronically Signed   By: Lynwood Landy Raddle M.D.   On: 05/07/2023 17:40    Procedures Procedures    Medications Ordered in ED Medications  ondansetron  (ZOFRAN -ODT) disintegrating tablet 8 mg (8 mg Oral Given 05/07/23 1713)    ED Course/ Medical Decision Making/ A&P                                 Medical Decision Making Amount and/or Complexity of Data Reviewed Labs: ordered. Radiology: ordered.  Risk Prescription drug management.   Patient is a 23 year old female with past medical history significant for seasonal allergies, paresthesias, weakness, MSK chest pain  Patient presents emergency room today with complaints of 2 days of nausea vomiting abdominal pain and loose stool also some cough congestion fatigue.  She denies any chest pain or shortness of breath currently.  Denies any fevers at home.  No unilateral or bilateral leg swelling no hemoptysis  She states she has felt overall more weak than usual.  No recent antibiotics, travel, no history of sick contacts.  Physical exam reassuring  UA unremarkable, hCG negative for pregnancy, CMP with only mild hypokalemia recommended increasing dietary potassium.  Lipase normal CBC without leukocytosis or clinically significant anemia.  Right upper quadrant ultrasound without cholecystitis or  gallstones.  Patient with reassuring reexamination of the abdomen.  Feels much improved is tolerating p.o.  Will discharge home with Zofran  and Bentyl .  Return precautions emergency room provided.  Patient agreeable to plan.    Final Clinical Impression(s) / ED Diagnoses Final diagnoses:  Diarrhea, unspecified type  Nausea vomiting and diarrhea    Rx / DC Orders ED Discharge Orders          Ordered    ondansetron  (ZOFRAN -ODT) 4 MG disintegrating tablet  Every 8 hours PRN        05/07/23  2018    dicyclomine  (BENTYL ) 20 MG tablet  2 times daily        05/07/23 2018              Neldon Inoue Farrell, [place] 05/07/23 2240    Jerral Meth, MD 05/09/23 1807

## 2023-05-07 NOTE — Discharge Instructions (Signed)
 1000mg  of tylenol  every 6 hours  Take the zofran  every 6-8 hours as needed for nausea.   Follow up with a primary care provider.   Return to ER if you expereince any new or concerning symptoms.

## 2023-06-26 ENCOUNTER — Emergency Department (HOSPITAL_COMMUNITY)
Admission: EM | Admit: 2023-06-26 | Discharge: 2023-06-26 | Disposition: A | Discharge status: Home / Self Care | Attending: Emergency Medicine | Admitting: Emergency Medicine

## 2023-06-26 ENCOUNTER — Encounter (HOSPITAL_COMMUNITY): Payer: Self-pay

## 2023-06-26 ENCOUNTER — Other Ambulatory Visit: Payer: Self-pay

## 2023-06-26 DIAGNOSIS — L292 Pruritus vulvae: Secondary | ICD-10-CM | POA: Diagnosis present

## 2023-06-26 DIAGNOSIS — N760 Acute vaginitis: Secondary | ICD-10-CM | POA: Insufficient documentation

## 2023-06-26 DIAGNOSIS — B9689 Other specified bacterial agents as the cause of diseases classified elsewhere: Secondary | ICD-10-CM | POA: Diagnosis not present

## 2023-06-26 LAB — URINALYSIS, ROUTINE W REFLEX MICROSCOPIC
Bilirubin Urine: NEGATIVE
Glucose, UA: NEGATIVE mg/dL
Hgb urine dipstick: NEGATIVE
Ketones, ur: 5 mg/dL — AB
Leukocytes,Ua: NEGATIVE
Nitrite: NEGATIVE
Protein, ur: NEGATIVE mg/dL
Specific Gravity, Urine: 1.03 (ref 1.005–1.030)
pH: 5 (ref 5.0–8.0)

## 2023-06-26 LAB — WET PREP, GENITAL
Sperm: NONE SEEN
Trich, Wet Prep: NONE SEEN
WBC, Wet Prep HPF POC: <10 (ref <10)
Yeast Wet Prep HPF POC: NONE SEEN

## 2023-06-26 LAB — HCG, SERUM, QUALITATIVE: Preg, Serum: NEGATIVE

## 2023-06-26 LAB — HIV ANTIBODY (ROUTINE TESTING W REFLEX): HIV Screen 4th Generation wRfx: NONREACTIVE

## 2023-06-26 LAB — RPR: RPR Ser Ql: NONREACTIVE

## 2023-06-26 MED ORDER — LIDOCAINE HCL (PF) 1 % IJ SOLN
1.0000 mL | Freq: Once | INTRAMUSCULAR | Status: AC
  Administered 2023-06-26: 1.2 mL
  Filled 2023-06-26: qty 30

## 2023-06-26 MED ORDER — METRONIDAZOLE 500 MG PO TABS
500.0000 mg | ORAL_TABLET | Freq: Once | ORAL | Status: AC
  Administered 2023-06-26: 500 mg via ORAL
  Filled 2023-06-26: qty 1

## 2023-06-26 MED ORDER — DOXYCYCLINE HYCLATE 100 MG PO TABS: 100.0000 mg | ORAL_TABLET | Freq: Two times a day (BID) | ORAL | 0 refills | Status: DC

## 2023-06-26 MED ORDER — METRONIDAZOLE 500 MG PO TABS: 500.0000 mg | ORAL_TABLET | Freq: Two times a day (BID) | ORAL | 0 refills | Status: DC

## 2023-06-26 MED ORDER — DOXYCYCLINE HYCLATE 100 MG PO TABS
100.0000 mg | ORAL_TABLET | Freq: Once | ORAL | Status: AC
Start: 2023-06-26 — End: 2023-06-26
  Administered 2023-06-26: 100 mg via ORAL
  Filled 2023-06-26: qty 1

## 2023-06-26 MED ORDER — DOXYCYCLINE HYCLATE 100 MG PO TABS: 100.0000 mg | ORAL_TABLET | Freq: Two times a day (BID) | ORAL | 0 refills | Status: AC

## 2023-06-26 MED ORDER — METRONIDAZOLE 500 MG PO TABS: 500.0000 mg | ORAL_TABLET | Freq: Two times a day (BID) | ORAL | 0 refills | Status: AC

## 2023-06-26 MED ORDER — CEFTRIAXONE SODIUM 1 G IJ SOLR
500.0000 mg | Freq: Once | INTRAMUSCULAR | Status: AC
  Administered 2023-06-26: 500 mg via INTRAMUSCULAR
  Filled 2023-06-26: qty 10

## 2023-06-26 MED ORDER — METRONIDAZOLE 500 MG PO TABS: 2000.0000 mg | ORAL_TABLET | Freq: Once | ORAL | Status: DC

## 2023-06-26 NOTE — ED Triage Notes (Signed)
 Patient presented to ER with vaginal itching. Patient states she has been 3 months celibate and last night had intercourse using a condom. Since then she has had a lot of vaginal itching. Denies knowing any allergies but concerned about latex allergy vs infection.

## 2023-06-26 NOTE — Discharge Instructions (Addendum)
 Evaluation today was overall reassuring.  Did reveal that you likely have bacterial vaginosis this would explain your vaginal itching.  I am starting on 7-day course of metronidazole.  You are also treated empirically for gonorrhea and chlamydia.  You will take doxycycline for the next 7 days as well.  Please follow-up on the results of your STD panel on MyChart.  If any of your results are positive, please advise your sexual partners to seek treatment at the health department and follow-up with your PCP.

## 2023-06-26 NOTE — ED Provider Notes (Signed)
 [place] EMERGENCY DEPARTMENT AT [facility] Provider Note   CSN: [id number] Arrival date & time: 06/26/23  5427     History  Chief Complaint  Patient presents with   Vaginal Itching   HPI Anita Mcbride is a 23 y.o. female presenting for vaginal itching.  Started 3 days ago.  States she had vaginal intercourse and her partner was using a condom.  The next day she started to have vaginal itching.  She denies any abnormal vaginal bleeding or discharge.  Denies any pelvic or abdominal pain.  Denies any urinary symptoms.  She is concerned that she might have a latex allergy versus infection.  Patient states that her last drink of alcohol was 2 weeks ago.   Vaginal Itching       Home Medications Prior to Admission medications   Medication Sig Start Date End Date Taking? Authorizing Provider  doxycycline (VIBRA-TABS) 100 MG tablet Take 1 tablet (100 mg total) by mouth 2 (two) times daily for 7 days. 06/26/23 07/03/23 Yes Gareth Eagle, PA-C  acetaminophen (TYLENOL) 500 MG tablet Take 1,000 mg by mouth every 6 (six) hours as needed for moderate pain.    [provider]  cetirizine (ZYRTEC) 10 MG tablet Take 10 mg by mouth daily as needed for allergies.    [provider]  dicyclomine (BENTYL) 20 MG tablet Take 1 tablet (20 mg total) by mouth 2 (two) times daily. 05/07/23   Gailen Shelter, PA  ibuprofen (ADVIL) 200 MG tablet Take 200 mg by mouth every 6 (six) hours as needed for moderate pain.    [provider]  loratadine (CLARITIN) 10 MG tablet Take 10 mg by mouth daily as needed for allergies.    [provider]  ondansetron (ZOFRAN-ODT) 4 MG disintegrating tablet Take 1 tablet (4 mg total) by mouth every 8 (eight) hours as needed for nausea or vomiting. 05/07/23   Gailen Shelter, PA      Allergies    Patient has no known allergies.    Review of Systems   See HPI  Physical Exam Updated Vital Signs BP (!) 142/80   Pulse 80    Temp 97.8 F (36.6 C) (Oral)   Resp 18   Ht 5\' 7"  (1.702 m)   Wt 119.7 kg   SpO2 100%   BMI 41.35 kg/m  Physical Exam Constitutional:      Appearance: Normal appearance.  HENT:     Head: Normocephalic.     Nose: Nose normal.  Eyes:     Conjunctiva/sclera: Conjunctivae normal.  Pulmonary:     Effort: Pulmonary effort is normal.  Neurological:     Mental Status: She is alert.  Psychiatric:        Mood and Affect: Mood normal.     ED Results / Procedures / Treatments   Labs (all labs ordered are listed, but only abnormal results are displayed) Labs Reviewed  WET PREP, GENITAL - Abnormal; Notable for the following components:      Result Value   Clue Cells Wet Prep HPF POC PRESENT (*)    All other components within normal limits  URINALYSIS, ROUTINE W REFLEX MICROSCOPIC - Abnormal; Notable for the following components:   APPearance HAZY (*)    Ketones, ur 5 (*)    All other components within normal limits  HCG, SERUM, QUALITATIVE  RPR  HIV ANTIBODY (ROUTINE TESTING W REFLEX)  GC/CHLAMYDIA PROBE AMP ([place]) NOT AT [facility]  EKG None  Radiology No results found.  Procedures Procedures    Medications Ordered in ED Medications  cefTRIAXone (ROCEPHIN) injection 500 mg (has no administration in time range)  lidocaine (PF) (XYLOCAINE) 1 % injection 1-2.1 mL (has no administration in time range)  metroNIDAZOLE (FLAGYL) tablet 2,000 mg (has no administration in time range)  doxycycline (VIBRA-TABS) tablet 100 mg (has no administration in time range)    ED Course/ Medical Decision Making/ A&P                                 Medical Decision Making Amount and/or Complexity of Data Reviewed Labs: ordered.   23 year old well-appearing female presenting for vaginal itching.  Exam unremarkable. Sent STI panel. Wet prep was positive for clue cells.  With her symptoms and wet prep findings, likely bacterial vaginosis.  Started on metronidazole.  Patient was  concerned she might have gonorrhea/chlamydia as well. Went ahead and treated her empirically and advised her to follow-up on the results of her STI panel on MyChart and how to proceed accordingly should her panel be positive in someway.  Advised her to follow-up with her PCP.  Discharged in good condition.        Final Clinical Impression(s) / ED Diagnoses Final diagnoses:  BV (bacterial vaginosis)    Rx / DC Orders ED Discharge Orders          Ordered    doxycycline (VIBRA-TABS) 100 MG tablet  2 times daily        06/26/23 1045              Gareth Eagle, PA-C 06/26/23 1049    Estelle June A, DO 06/28/23 [phone]

## 2023-06-29 LAB — GC/CHLAMYDIA PROBE AMP (~~LOC~~) NOT AT ARMC
Chlamydia: POSITIVE — AB
Comment: NEGATIVE
Comment: NORMAL
Neisseria Gonorrhea: NEGATIVE

## 2023-09-10 DIAGNOSIS — E162 Hypoglycemia, unspecified: Secondary | ICD-10-CM | POA: Diagnosis not present

## 2023-09-10 DIAGNOSIS — R079 Chest pain, unspecified: Secondary | ICD-10-CM | POA: Diagnosis not present

## 2023-09-10 DIAGNOSIS — I10 Essential (primary) hypertension: Secondary | ICD-10-CM | POA: Diagnosis not present

## 2023-09-10 DIAGNOSIS — R000 Tachycardia, unspecified: Secondary | ICD-10-CM | POA: Diagnosis not present

## 2023-09-10 DIAGNOSIS — E161 Other hypoglycemia: Secondary | ICD-10-CM | POA: Diagnosis not present

## 2023-09-10 DIAGNOSIS — Z5321 Procedure and treatment not carried out due to patient leaving prior to being seen by health care provider: Secondary | ICD-10-CM | POA: Diagnosis not present

## 2023-09-15 DIAGNOSIS — R9431 Abnormal electrocardiogram [ECG] [EKG]: Secondary | ICD-10-CM | POA: Diagnosis not present

## 2023-10-28 DIAGNOSIS — R208 Other disturbances of skin sensation: Secondary | ICD-10-CM | POA: Diagnosis not present

## 2023-10-28 DIAGNOSIS — L299 Pruritus, unspecified: Secondary | ICD-10-CM | POA: Diagnosis not present

## 2023-10-28 NOTE — ED Provider Notes (Signed)
 ------------------------------------------------------------------------------- Attestation signed by Morene Clarke, MD at 11/01/23 1530 I have directly supervised the care of this patient.  -------------------------------------------------------------------------------   [facility]  ED Provider Note Medical screening exam initiated in triage. HPI reviewed. Vital signs reviewed. Burning and itching to palms, she believes possible allergic reaction  Patient stable and in no distress in triage. [place], PA-C 12:52 PM 10/28/2023    Anita Mcbride 22 y.o. female DOB: 29-Apr-2000 MRN: [id number] History   Chief Complaint  Patient presents with  . Allergic Reaction    Pt endorses having redness, itching, burning to bilateral hands, no hives noted. NKA & is unsure what she touched.    23 year old female presents to the ED for evaluation of bilateral hand itching.  She reports that the itching was initially on both of her palms but now she only notices on the left.  Denies any overlying skin changes.  Denies any pain or injury.  Denies any contact with any known allergens.  No new soaps, detergents or medications.   History provided by:  Patient Language interpreter used: No   Allergic Reaction      History reviewed. No pertinent past medical history.  History reviewed. No pertinent surgical history.  Social History   Substance and Sexual Activity  Alcohol Use None   Tobacco Use History[1] E-Cigarettes  . Vaping Use    . Start Date    . Cartridges/Day    . Quit Date     Social History   Substance and Sexual Activity  Drug Use Not on file         Allergies[2]  There are no discharge medications for this patient.   Primary Survey  Primary Survey  Review of Systems   Review of Systems  Skin:        Itching of bilateral hands    Physical Exam   ED Triage Vitals [10/28/23 1252]  BP (!) 151/92  Heart Rate 101  Resp 16   SpO2 99 %  Temp 98.4 F (36.9 C)    Physical Exam  Nursing note and vitals reviewed. Constitutional: She appears well-developed and well-nourished. She is in good hygiene. She has good hygiene. She does not appear distressed, does not appear ill and no respiratory distress. Not diaphoretic.She is active.  Patient sitting comfortably on stretcher, alert and oriented. Patient speaking easily in full sentences, without an increased work of breathing. Patient appears healthy.  HENT:  Head: Normocephalic and atraumatic.  Nose: Nose normal.  Mouth/Throat: Voice normal.  Airway widely patent  Eyes: EOM are intact. Conjunctivae are normal. Pupils are equal, round, and reactive to light.  Neck: Normal range of motion and voice normal. Neck supple.  Cardiovascular: Normal rate, regular rhythm and normal heart sounds.  Pulmonary/Chest: No respiratory distress. Not tachypneic. Respiratory effort normal and breath sounds normal.  Musculoskeletal:     Cervical back: Normal range of motion and neck supple.   Neurological: She is alert and oriented to person, place, and time. She has normal speech.  Skin: Skin is warm. Not diaphoretic. Skin is dry.  No erythema, warmth or rash to bilateral hands.  Patient has adequate strength.  Adequate radial pulses and capillary refill on bilateral hands.  Psychiatric: She has a normal mood and affect. Her behavior is normal. Judgment and thought content normal.     ED Course   Lab results: No data to display  Imaging: No data to display    ECG: ECG Results  None                                                                     Pre-Sedation Procedures    Medical Decision Making 23 year old female presents to the ED for evaluation of bilateral hand itching.   The differential diagnosis associated with the patient's presentation includes: Allergic reaction, contact dermatitis, anxiety reaction, but not  limited to  Patient vital stable at time of presentation.  She is satting 99% on room air and is well-appearing.  Patient has no signs or symptoms of anaphylaxis and no throat closing sensation.  She reports her symptoms are improving.  I discussed Zyrtec versus Benadryl.  Plan was to discharge patient.  I had ordered Benadryl for her to take prior to arrival but she left the ED without her discharge paperwork or Benadryl.  She was stable at time of presentation.  On initial evaluation I was able to discuss signs and symptoms of anaphylaxis and reasons to return to the ED.  I considered admitting/observing and elected not to, patient stable at time of discharge  Tests considered but not performed: no.  Prescription medication was considered but ultimately not given after discussion with patient/family (e.g., pain medication, antiviral, antibiotic) : other, none  Chronic condition affecting patient care: History reviewed. No pertinent past medical history.  Patient's care significantly limited by social determinants of health including : Other social determinant of health. none   Problems Addressed: Itching of both hands: acute illness or injury  Amount and/or Complexity of Data Reviewed Independent Historian:     Details: Patient  Risk Decision regarding hospitalization. Diagnosis or treatment significantly limited by social determinants of health.            Provider Communication  There are no discharge medications for this patient.   There are no discharge medications for this patient.   There are no discharge medications for this patient.   Clinical Impression Final diagnoses:  Itching of both hands    ED Disposition     ED Disposition  Discharge   Condition  Stable   Comment  --                 Follow-up Information     Go to  [facility] Emergency Department.   Specialty: Emergency Medicine Comments: As needed Contact information: [address] [place]  [address] [phone]                 Electronically signed by:       [1] Social History Tobacco Use  Smoking Status Not on file  Smokeless Tobacco Not on file  [2] No Known Allergies  Anita DELENA Emmer, PA-C 11/01/23 1326

## 2023-11-05 ENCOUNTER — Emergency Department (HOSPITAL_COMMUNITY)
Admission: EM | Admit: 2023-11-05 | Discharge: 2023-11-06 | Disposition: A | Discharge status: Home / Self Care | Attending: Emergency Medicine | Admitting: Emergency Medicine

## 2023-11-05 ENCOUNTER — Other Ambulatory Visit: Payer: Self-pay

## 2023-11-05 ENCOUNTER — Encounter (HOSPITAL_COMMUNITY): Payer: Self-pay

## 2023-11-05 ENCOUNTER — Emergency Department (HOSPITAL_COMMUNITY)

## 2023-11-05 DIAGNOSIS — M79601 Pain in right arm: Secondary | ICD-10-CM | POA: Diagnosis not present

## 2023-11-05 DIAGNOSIS — N3090 Cystitis, unspecified without hematuria: Secondary | ICD-10-CM | POA: Diagnosis not present

## 2023-11-05 DIAGNOSIS — R791 Abnormal coagulation profile: Secondary | ICD-10-CM | POA: Insufficient documentation

## 2023-11-05 DIAGNOSIS — R1084 Generalized abdominal pain: Secondary | ICD-10-CM | POA: Diagnosis not present

## 2023-11-05 DIAGNOSIS — R52 Pain, unspecified: Secondary | ICD-10-CM

## 2023-11-05 DIAGNOSIS — R1011 Right upper quadrant pain: Secondary | ICD-10-CM | POA: Diagnosis not present

## 2023-11-05 DIAGNOSIS — M542 Cervicalgia: Secondary | ICD-10-CM | POA: Insufficient documentation

## 2023-11-05 DIAGNOSIS — R102 Pelvic and perineal pain: Secondary | ICD-10-CM | POA: Diagnosis present

## 2023-11-05 DIAGNOSIS — D649 Anemia, unspecified: Secondary | ICD-10-CM | POA: Diagnosis not present

## 2023-11-05 DIAGNOSIS — M791 Myalgia, unspecified site: Secondary | ICD-10-CM | POA: Diagnosis not present

## 2023-11-05 DIAGNOSIS — J90 Pleural effusion, not elsewhere classified: Secondary | ICD-10-CM | POA: Diagnosis not present

## 2023-11-05 DIAGNOSIS — E8809 Other disorders of plasma-protein metabolism, not elsewhere classified: Secondary | ICD-10-CM | POA: Diagnosis not present

## 2023-11-05 LAB — CBC WITH DIFFERENTIAL/PLATELET
Abs Immature Granulocytes: 0.04 K/uL (ref 0.00–0.07)
Basophils Absolute: 0 K/uL (ref 0.0–0.1)
Basophils Relative: 0 %
Eosinophils Absolute: 0.2 K/uL (ref 0.0–0.5)
Eosinophils Relative: 2 %
HCT: 33.7 % — ABNORMAL LOW (ref 36.0–46.0)
Hemoglobin: 10 g/dL — ABNORMAL LOW (ref 12.0–15.0)
Immature Granulocytes: 0 %
Lymphocytes Relative: 19 %
Lymphs Abs: 1.8 K/uL (ref 0.7–4.0)
MCH: 22.7 pg — ABNORMAL LOW (ref 26.0–34.0)
MCHC: 29.7 g/dL — ABNORMAL LOW (ref 30.0–36.0)
MCV: 76.4 fL — ABNORMAL LOW (ref 80.0–100.0)
Monocytes Absolute: 0.8 K/uL (ref 0.1–1.0)
Monocytes Relative: 8 %
Neutro Abs: 6.7 K/uL (ref 1.7–7.7)
Neutrophils Relative %: 71 %
Platelets: 370 K/uL (ref 150–400)
RBC: 4.41 MIL/uL (ref 3.87–5.11)
RDW: 15.9 % — ABNORMAL HIGH (ref 11.5–15.5)
WBC: 9.5 K/uL (ref 4.0–10.5)
nRBC: 0 % (ref 0.0–0.2)

## 2023-11-05 LAB — URINALYSIS, ROUTINE W REFLEX MICROSCOPIC
Bilirubin Urine: NEGATIVE
Glucose, UA: NEGATIVE mg/dL
Ketones, ur: 5 mg/dL — AB
Nitrite: NEGATIVE
Protein, ur: 30 mg/dL — AB
Specific Gravity, Urine: 1.025 (ref 1.005–1.030)
pH: 6 (ref 5.0–8.0)

## 2023-11-05 LAB — COMPREHENSIVE METABOLIC PANEL WITH GFR
ALT: 12 U/L (ref 0–44)
AST: 13 U/L — ABNORMAL LOW (ref 15–41)
Albumin: 3.4 g/dL — ABNORMAL LOW (ref 3.5–5.0)
Alkaline Phosphatase: 61 U/L (ref 38–126)
Anion gap: 9 (ref 5–15)
BUN: 8 mg/dL (ref 6–20)
CO2: 24 mmol/L (ref 22–32)
Calcium: 9.1 mg/dL (ref 8.9–10.3)
Chloride: 103 mmol/L (ref 98–111)
Creatinine, Ser: 0.77 mg/dL (ref 0.44–1.00)
GFR, Estimated: >60 mL/min (ref >60)
Glucose, Bld: 96 mg/dL (ref 70–99)
Potassium: 3.5 mmol/L (ref 3.5–5.1)
Sodium: 136 mmol/L (ref 135–145)
Total Bilirubin: 1.2 mg/dL (ref 0.0–1.2)
Total Protein: 7.9 g/dL (ref 6.5–8.1)

## 2023-11-05 LAB — LIPASE, BLOOD: Lipase: 26 U/L (ref 11–51)

## 2023-11-05 LAB — RESP PANEL BY RT-PCR (RSV, FLU A&B, COVID)  RVPGX2
Influenza A by PCR: NEGATIVE
Influenza B by PCR: NEGATIVE
Resp Syncytial Virus by PCR: NEGATIVE
SARS Coronavirus 2 by RT PCR: NEGATIVE

## 2023-11-05 LAB — HCG, SERUM, QUALITATIVE: Preg, Serum: NEGATIVE

## 2023-11-05 LAB — D-DIMER, QUANTITATIVE: D-Dimer, Quant: 2.24 ug{FEU}/mL — ABNORMAL HIGH (ref 0.00–0.50)

## 2023-11-05 MED ORDER — IOHEXOL 350 MG/ML SOLN
100.0000 mL | Freq: Once | INTRAVENOUS | Status: AC | PRN
  Administered 2023-11-05: 100 mL via INTRAVENOUS

## 2023-11-05 MED ORDER — HYDROCODONE-ACETAMINOPHEN 5-325 MG PO TABS
1.0000 | ORAL_TABLET | Freq: Once | ORAL | Status: AC
  Administered 2023-11-05: 1 via ORAL
  Filled 2023-11-05: qty 1

## 2023-11-05 NOTE — ED Provider Notes (Addendum)
 [place] EMERGENCY DEPARTMENT AT [facility] Provider Note   CSN: [id number] Arrival date & time: 11/05/23  8143     Patient presents with: Abdominal Pain   Anita Mcbride is a 23 y.o. female presents today for 6 days of generalized abdominal pain.  Patient also reports pelvic pain, nausea, dysuria, right arm/neck pain that is worse with movement and deep inspiration.  Patient denies fever, chills, vomiting, chest pain, shortness of breath, numbness, weakness, vaginal discharge or diarrhea.    Abdominal Pain Associated symptoms: dysuria, fatigue and nausea        Prior to Admission medications   Medication Sig Start Date End Date Taking? Authorizing Provider  cephALEXin  (KEFLEX ) 500 MG capsule Take 1 capsule (500 mg total) by mouth 4 (four) times daily. 11/06/23  Yes Ruthell Lonni FALCON, PA-C  acetaminophen  (TYLENOL ) 500 MG tablet Take 1,000 mg by mouth every 6 (six) hours as needed for moderate pain.    [provider]  cetirizine (ZYRTEC) 10 MG tablet Take 10 mg by mouth daily as needed for allergies.    [provider]  dicyclomine  (BENTYL ) 20 MG tablet Take 1 tablet (20 mg total) by mouth 2 (two) times daily. 05/07/23   Neldon Hamp RAMAN, PA  ibuprofen  (ADVIL ) 200 MG tablet Take 200 mg by mouth every 6 (six) hours as needed for moderate pain.    [provider]  loratadine (CLARITIN) 10 MG tablet Take 10 mg by mouth daily as needed for allergies.    [provider]  metroNIDAZOLE  (FLAGYL ) 500 MG tablet Take 1 tablet (500 mg total) by mouth 2 (two) times daily. 06/26/23   Robinson, John K, PA-C  ondansetron  (ZOFRAN -ODT) 4 MG disintegrating tablet Take 1 tablet (4 mg total) by mouth every 8 (eight) hours as needed for nausea or vomiting. 05/07/23   Neldon Hamp RAMAN, PA    Allergies: Patient has no known allergies.    Review of Systems  Constitutional:  Positive for fatigue.  Gastrointestinal:  Positive for abdominal pain and nausea.   Genitourinary:  Positive for dysuria and pelvic pain.  Musculoskeletal:  Positive for arthralgias.    Updated Vital Signs BP 130/75 (BP Location: Left Arm)   Pulse 91   Temp 98 F (36.7 C) (Oral)   Resp 18   Ht 5' 7 (1.702 m)   Wt 108.9 kg   SpO2 100%   BMI 37.59 kg/m   Physical Exam Vitals and nursing note reviewed.  Constitutional:      General: She is not in acute distress.    Appearance: She is well-developed. She is obese. She is not ill-appearing or toxic-appearing.  HENT:     Head: Normocephalic and atraumatic.  Eyes:     Extraocular Movements: Extraocular movements intact.     Conjunctiva/sclera: Conjunctivae normal.  Cardiovascular:     Rate and Rhythm: Regular rhythm. Tachycardia present.     Heart sounds: Normal heart sounds. No murmur heard. Pulmonary:     Effort: Pulmonary effort is normal. No respiratory distress.     Breath sounds: Normal breath sounds.  Abdominal:     General: There is no distension.     Palpations: Abdomen is soft.     Tenderness: There is generalized abdominal tenderness. There is no right CVA tenderness or left CVA tenderness. Negative signs include Murphy's sign, Rovsing's sign and McBurney's sign.  Musculoskeletal:        General: No swelling.     Cervical back: Neck supple.  Comments: Equal bilateral grip strength  Skin:    General: Skin is warm and dry.     Capillary Refill: Capillary refill takes less than 2 seconds.  Neurological:     General: No focal deficit present.     Mental Status: She is alert.     Motor: No weakness.  Psychiatric:        Mood and Affect: Mood normal.     (all labs ordered are listed, but only abnormal results are displayed) Labs Reviewed  COMPREHENSIVE METABOLIC PANEL WITH GFR - Abnormal; Notable for the following components:      Result Value   Albumin 3.4 (*)    AST 13 (*)    All other components within normal limits  URINALYSIS, ROUTINE W REFLEX MICROSCOPIC - Abnormal; Notable for  the following components:   APPearance HAZY (*)    Hgb urine dipstick MODERATE (*)    Ketones, ur 5 (*)    Protein, ur 30 (*)    Leukocytes,Ua TRACE (*)    Bacteria, UA MANY (*)    All other components within normal limits  CBC WITH DIFFERENTIAL/PLATELET - Abnormal; Notable for the following components:   Hemoglobin 10.0 (*)    HCT 33.7 (*)    MCV 76.4 (*)    MCH 22.7 (*)    MCHC 29.7 (*)    RDW 15.9 (*)    All other components within normal limits  D-DIMER, QUANTITATIVE - Abnormal; Notable for the following components:   D-Dimer, Quant 2.24 (*)    All other components within normal limits  RESP PANEL BY RT-PCR (RSV, FLU A&B, COVID)  [id number]  URINE CULTURE  LIPASE, BLOOD  HCG, SERUM, QUALITATIVE    EKG: None  Radiology: CT Angio Chest PE W and/or Wo Contrast Result Date: 11/06/2023 CLINICAL DATA:  Right upper abdomen pain EXAM: CT ANGIOGRAPHY CHEST WITH CONTRAST TECHNIQUE: Multidetector CT imaging of the chest was performed using the standard protocol during bolus administration of intravenous contrast. Multiplanar CT image reconstructions and MIPs were obtained to evaluate the vascular anatomy. RADIATION DOSE REDUCTION: This exam was performed according to the departmental dose-optimization program which includes automated exposure control, adjustment of the mA and/or kV according to patient size and/or use of iterative reconstruction technique. CONTRAST:  OMNIPAQUE  IOHEXOL  350 MG/ML SOLN COMPARISON:  Chest x-ray 11/05/2023 FINDINGS: Cardiovascular: Satisfactory opacification of the pulmonary arteries to the segmental level. No evidence of pulmonary embolism. Normal heart size. No pericardial effusion. Nonaneurysmal aorta Mediastinum/Nodes: No enlarged mediastinal, hilar, or axillary lymph nodes. Thyroid  gland, trachea, and esophagus demonstrate no significant findings. Lungs/Pleura: Trace right-sided pleural effusion. No acute airspace disease or pneumothorax. Upper Abdomen: See  separately dictated abdomen CT Musculoskeletal: No chest wall abnormality. No acute or significant osseous findings. Review of the MIP images confirms the above findings. IMPRESSION: 1. Negative for acute pulmonary embolus. 2. Trace right-sided pleural effusion. Electronically Signed   By: Luke Bun M.D.   On: 11/06/2023 00:02   CT ABDOMEN PELVIS W CONTRAST Result Date: 11/05/2023 CLINICAL DATA:  Generalized abdominal pain EXAM: CT ABDOMEN AND PELVIS WITH CONTRAST TECHNIQUE: Multidetector CT imaging of the abdomen and pelvis was performed using the standard protocol following bolus administration of intravenous contrast. RADIATION DOSE REDUCTION: This exam was performed according to the departmental dose-optimization program which includes automated exposure control, adjustment of the mA and/or kV according to patient size and/or use of iterative reconstruction technique. CONTRAST:  OMNIPAQUE  IOHEXOL  350 MG/ML SOLN COMPARISON:  None Available. FINDINGS:  Lower chest: See separately dictated chest CT Hepatobiliary: No focal liver abnormality is seen. No gallstones, gallbladder wall thickening, or biliary dilatation. Pancreas: Unremarkable. No pancreatic ductal dilatation or surrounding inflammatory changes. Spleen: Normal in size without focal abnormality. Adrenals/Urinary Tract: Adrenal glands are unremarkable. Kidneys are normal, without renal calculi, focal lesion, or hydronephrosis. Bladder is unremarkable. Stomach/Bowel: Stomach is within normal limits. Appendix appears normal. No evidence of bowel wall thickening, distention, or inflammatory changes. Vascular/Lymphatic: No significant vascular findings are present. No enlarged abdominal or pelvic lymph nodes. Reproductive: Uterus and bilateral adnexa are unremarkable. Other: No abdominal wall hernia or abnormality. No abdominopelvic ascites. Musculoskeletal: No acute or significant osseous findings. IMPRESSION: No CT evidence of acute abnormality in  the abdomen or pelvis. Electronically Signed   By: Luke Bun M.D.   On: 11/05/2023 23:59   DG Chest 2 View Result Date: 11/05/2023 CLINICAL DATA:  Generalized abdominal pain for 6 days, right upper pain radiating to neck EXAM: CHEST - 2 VIEW COMPARISON:  05/20/2016 FINDINGS: Frontal and lateral views of the chest demonstrate an unremarkable cardiac silhouette. No acute airspace disease, effusion, or pneumothorax. No acute bony abnormalities. IMPRESSION: 1. No acute intrathoracic process. Electronically Signed   By: Ozell Daring M.D.   On: 11/05/2023 20:10     Procedures   Medications Ordered in the ED  HYDROcodone -acetaminophen  (NORCO/VICODIN) 5-325 MG per tablet 1 tablet (1 tablet Oral Given 11/05/23 2240)  iohexol  (OMNIPAQUE ) 350 MG/ML injection 100 mL (100 mLs Intravenous Contrast Given 11/05/23 2322)                                     Medical Decision Making Amount and/or Complexity of Data Reviewed Labs: ordered. Radiology: ordered.  Risk Prescription drug management.   This patient presents to the ED for concern of abdominal pain, right sided chest pain, pelvic pain, nausea, this involves an extensive number of treatment options, and is a complaint that carries with it a high risk of complications and morbidity.  The differential diagnosis includes appendicitis, pancreatitis, choledocholithiasis, acute cholecystitis, viral GI illness, colitis, PE, musculoskeletal pain, constipation, UTI, pyonephritis, pregnancy  Additional history obtained:  Additional history obtained from EMR External records from outside source obtained and reviewed including Care Everywhere   Lab Tests:  I Ordered, and personally interpreted labs.  The pertinent results include: anemia at 10.0 which is not uncommon per historical values, mildly decreased albumin at 3.4, mildly decreased AST at 13, lipase 26, negative pregnancy, positive D-dimer 2.24, negative respiratory panel   Imaging Studies  ordered:  I ordered imaging studies including CT abdomen pelvis with contrast I independently visualized and interpreted imaging which showed pending I agree with the radiologist interpretation Chest x-ray showed no acute intrathoracic process CTA PE which showed pending   Problem List / ED Course / Critical interventions / Medication management I ordered medication including Norco   I have reviewed the patients home medicines and have made adjustments as needed  2:00 PM Care of Nada Sabino transferred to [facility] and Dr. Midge at the end of my shift as the patient will require reassessment once labs/imaging have resulted. Patient presentation, ED course, and plan of care discussed with review of all pertinent labs and imaging. Please see his/her note for further details regarding further ED course and disposition. Plan at time of handoff is Imaging which will determine disposition. This may be altered or completely  changed at the discretion of the oncoming team pending results of further workup.     Final diagnoses:  Generalized body aches  Cystitis    ED Discharge Orders          Ordered    cephALEXin  (KEFLEX ) 500 MG capsule  4 times daily        11/06/23 0014               Francis Ileana SAILOR, PA-C 11/05/23 2338    Francis Ileana SAILOR, PA-C 11/06/23 1359    Francis Ileana SAILOR, PA-C 11/06/23 1400    Dasie Faden, MD 11/09/23 [phone]

## 2023-11-05 NOTE — ED Triage Notes (Signed)
 Patient has had generalized abdominal pain for 6 days. Pain has now moved to her right upper abdomen and into her right neck. Has pelvic pain too. Feels nauseous.

## 2023-11-06 MED ORDER — CEPHALEXIN 500 MG PO CAPS: 500.0000 mg | ORAL_CAPSULE | Freq: Four times a day (QID) | ORAL | 0 refills | Status: AC

## 2023-11-06 NOTE — ED Provider Notes (Signed)
  Physical Exam  BP (!) 148/96   Pulse 74   Temp 98.2 F (36.8 C) (Oral)   Resp 18   Ht 5' 7 (1.702 m)   Wt 108.9 kg   SpO2 96%   BMI 37.59 kg/m   Physical Exam Vitals and nursing note reviewed.  Constitutional:      General: She is not in acute distress.    Appearance: She is not toxic-appearing.  HENT:     Head: Normocephalic and atraumatic.  Pulmonary:     Effort: No respiratory distress.  Skin:    Coloration: Skin is not jaundiced or pale.  Neurological:     Mental Status: She is alert and oriented to person, place, and time.  Psychiatric:        Behavior: Behavior normal.     Procedures  Procedures  ED Course / MDM    Medical Decision Making Amount and/or Complexity of Data Reviewed Labs: ordered. Radiology: ordered.  Risk Prescription drug management.   23 year old female signed out to me at shift change pending imaging.  Please see the previous provider note for further details.  In short, 23 year old female presents for abdominal pain, dysuria.  Signed out pending CTA, CT abdomen pelvis.  CTA negative for PE.  CT abdomen pelvis unremarkable for acute process.  Does show trace pleural effusion however patient denies chest pain or shortness of breath.  Will be treated for UTI at this time.  Prescribed Keflex  4 times a day for 5 days.  Urine cultured at this time.  Discharged in stable condition.  Return precautions provided and she voiced understanding.  Stable discharge.       Ruthell Lonni FALCON, PA-C 11/06/23 0017    Midge Golas, MD 11/06/23 [phone]

## 2023-11-06 NOTE — Discharge Instructions (Addendum)
 It was a pleasure taking part in your care.  As discussed, I am treating you for UTI.  Please begin taking Keflex  4 times a day for 5 days.  Please read attached guide concerning UTI.  If you develop any new or worsening symptoms please return to the ED.  Follow-up with PCP.  If you do not have 1, I have referred you to [facility] health center.  Please call and make an appointment to be seen.

## 2023-11-07 LAB — URINE CULTURE

## 2024-01-19 ENCOUNTER — Telehealth: Payer: Self-pay

## 2024-01-19 DIAGNOSIS — R079 Chest pain, unspecified: Secondary | ICD-10-CM

## 2024-01-19 NOTE — Progress Notes (Signed)
 Complex Care Management Note Care Guide Note  01/19/2024 Name: Anita Mcbride MRN: [id number] DOB: 15-Apr-2000   Complex Care Management Outreach Attempts: An unsuccessful telephone outreach was attempted today to offer the patient information about available complex care management services.  Follow Up Plan:  Additional outreach attempts will be made to offer the patient complex care management information and services.   Encounter Outcome:  Patient Request to Call Back  Anita [facility]br[facility]  [facility], [facility] VBCI Assistant Direct Dial: [phone]  Fax: [phone]

## 2024-01-21 ENCOUNTER — Other Ambulatory Visit: Payer: Self-pay

## 2024-01-21 ENCOUNTER — Emergency Department (HOSPITAL_COMMUNITY)

## 2024-01-21 ENCOUNTER — Emergency Department (HOSPITAL_COMMUNITY)
Admission: EM | Admit: 2024-01-21 | Discharge: 2024-01-21 | Disposition: A | Discharge status: Home / Self Care | Attending: Emergency Medicine | Admitting: Emergency Medicine

## 2024-01-21 DIAGNOSIS — J069 Acute upper respiratory infection, unspecified: Secondary | ICD-10-CM | POA: Insufficient documentation

## 2024-01-21 DIAGNOSIS — J029 Acute pharyngitis, unspecified: Secondary | ICD-10-CM | POA: Diagnosis not present

## 2024-01-21 DIAGNOSIS — R059 Cough, unspecified: Secondary | ICD-10-CM | POA: Diagnosis not present

## 2024-01-21 DIAGNOSIS — R053 Chronic cough: Secondary | ICD-10-CM | POA: Diagnosis not present

## 2024-01-21 LAB — RESP PANEL BY RT-PCR (RSV, FLU A&B, COVID)  RVPGX2
Influenza A by PCR: NEGATIVE
Influenza B by PCR: NEGATIVE
Resp Syncytial Virus by PCR: NEGATIVE
SARS Coronavirus 2 by RT PCR: NEGATIVE

## 2024-01-21 LAB — GROUP A STREP BY PCR: Group A Strep by PCR: NOT DETECTED

## 2024-01-21 MED ORDER — GUAIFENESIN-DM 100-10 MG/5ML PO SYRP: 5.0000 mL | ORAL_SOLUTION | ORAL | 0 refills | Status: AC | PRN

## 2024-01-21 MED ORDER — OXYMETAZOLINE HCL 0.05 % NA SOLN: 1.0000 | Freq: Two times a day (BID) | NASAL | 0 refills | Status: AC

## 2024-01-21 NOTE — ED Triage Notes (Signed)
 Patient c/o URI x 2 days. Patient report worsening cough and sore throat. Patient denies fever. Patient denies N/V.

## 2024-01-21 NOTE — ED Provider Notes (Signed)
 23 year old female without significant medical history here with cough, URI type symptoms.  Ongoing for around 2 days.  Intermittent cough with yellow-colored mucus.  No fevers or chills.  No dyspnea.  She had 1 episode of posttussive emesis but otherwise tolerant p.o. without difficulty.  No change to bowel or bladder function.  Has not been taking medications for symptoms.  Has history of prior lung disease.  Her exam is reassuring, lungs are clear, no hypoxia, she appears well-hydrated.  She has possible tonsil stone on right but o/w oropharynx is wnl, uvula is midline. Chest x-ray was without evidence of pneumonia, swabs are negative.  Likely viral infection.  Encouraged reports of care at home including antitussive, rehydration, humidifier in the bedroom, plenty of rest.  Advised her to follow with PCP.  8:03 AM:  I have discussed the diagnosis/risks/treatment options with the patient.  Evaluation and diagnostic testing in the emergency department does not suggest an emergent condition requiring admission or immediate intervention beyond what has been performed at this time.  They will follow up with pcp. We also discussed returning to the ED immediately if new or worsening sx occur. We discussed the sx which are most concerning (e.g., sudden worsening pain, fever, inability to tolerate by mouth , dyspnea, cp) that necessitate immediate return.    The patient appears reasonably screened and/or stabilized for discharge and I doubt any other medical condition or other [facility] requiring further screening, evaluation, or treatment in the ED at this time prior to discharge.     Elnor Jayson LABOR, DO 01/21/24 [phone]

## 2024-01-21 NOTE — Discharge Instructions (Addendum)
 You have been seen in the Emergency Department (ED) today for a likely viral illness.  Please drink plenty of clear fluids (water, Gatorade, chicken broth, etc).  You may use Tylenol and/or Motrin according to label instructions.  You can alternate between the two without any side effects.   Please follow up with your doctor as listed above.  Call your doctor or return to the Emergency Department (ED) if you are unable to tolerate fluids due to vomiting, have worsening trouble breathing, become extremely tired or difficult to awaken, or if you develop any other symptoms that concern you.

## 2024-01-21 NOTE — ED Provider Triage Note (Signed)
 Emergency Medicine Provider Triage Evaluation Note  Anita Mcbride , a 23 y.o. female  was evaluated in triage.  Pt complains of cough, pus on tonsils with scratchy throat. Onset 3 days ago.   Review of Systems  Positive: Cough, sore throat  Negative: Fever, body aches  Physical Exam  BP 131/82 (BP Location: Left Arm)   Pulse 93   Temp 98.7 F (37.1 C) (Oral)   Resp 18   SpO2 99%  Gen:   Awake, no distress   Resp:  Normal effort  MSK:   Moves extremities without difficulty  Other:  Tonsils red, no exudate   Medical Decision Making  Medically screening exam initiated at 5:22 AM.  Appropriate orders placed.  Analyce D Duffee was informed that the remainder of the evaluation will be completed by another provider, this initial triage assessment does not replace that evaluation, and the importance of remaining in the ED until their evaluation is complete.     Beverley Leita LABOR, PA-C 01/21/24 [phone]

## 2024-01-21 NOTE — ED Provider Notes (Signed)
  EMERGENCY DEPARTMENT AT [facility] Provider Note   CSN: [id number] Arrival date & time: 01/21/24  9541     Patient presents with: URI   Anita Mcbride is a 23 y.o. female.   2 days ago, patient developed persistent frequent cough productive of yellowish mucus. Coughing frequently and chest hurts from cough. Throat also hurts, though still able to drink. Vomited once after coughing heavily. Denies fevers, rhinorrhea, nausea/vomiting, diarrhea (last BM yesterday), shortness of breath. Works Office manager job, interacts with a lot of people. No history of tonsil stones. Maybe endorses some neck swelling, a little more pain on the right.   URI Presenting symptoms: cough and sore throat   Presenting symptoms: no congestion, no ear pain, no fever and no rhinorrhea   Associated symptoms: no arthralgias, no headaches, no sinus pain and no wheezing       Prior to Admission medications   Medication Sig Start Date End Date Taking? Authorizing Provider  acetaminophen  (TYLENOL ) 500 MG tablet Take 1,000 mg by mouth every 6 (six) hours as needed for moderate pain.    [provider]  cephALEXin  (KEFLEX ) 500 MG capsule Take 1 capsule (500 mg total) by mouth 4 (four) times daily. 11/06/23   Ruthell Lonni FALCON, PA-C  cetirizine (ZYRTEC) 10 MG tablet Take 10 mg by mouth daily as needed for allergies.    [provider]  dicyclomine  (BENTYL ) 20 MG tablet Take 1 tablet (20 mg total) by mouth 2 (two) times daily. 05/07/23   Neldon Hamp RAMAN, PA  ibuprofen  (ADVIL ) 200 MG tablet Take 200 mg by mouth every 6 (six) hours as needed for moderate pain.    [provider]  loratadine (CLARITIN) 10 MG tablet Take 10 mg by mouth daily as needed for allergies.    [provider]  metroNIDAZOLE  (FLAGYL ) 500 MG tablet Take 1 tablet (500 mg total) by mouth 2 (two) times daily. 06/26/23   Lang Norleen POUR, PA-C  ondansetron  (ZOFRAN -ODT) 4 MG disintegrating  tablet Take 1 tablet (4 mg total) by mouth every 8 (eight) hours as needed for nausea or vomiting. 05/07/23   Neldon Hamp RAMAN, PA    Allergies: Patient has no known allergies.    Review of Systems  Constitutional:  Negative for chills and fever.  HENT:  Positive for sore throat. Negative for congestion, ear discharge, ear pain, rhinorrhea, sinus pressure, sinus pain, tinnitus and trouble swallowing.   Eyes:  Negative for pain and visual disturbance.  Respiratory:  Positive for cough. Negative for choking, chest tightness, shortness of breath and wheezing.   Cardiovascular:  Negative for chest pain and palpitations.  Gastrointestinal:  Negative for abdominal pain, diarrhea, nausea and vomiting.  Genitourinary:  Negative for decreased urine volume, dysuria, frequency and hematuria.  Musculoskeletal:  Negative for arthralgias and back pain.  Skin:  Negative for color change and rash.  Neurological:  Negative for seizures, syncope and headaches.  All other systems reviewed and are negative.   Updated Vital Signs BP 131/82 (BP Location: Left Arm)   Pulse 93   Temp 98.7 F (37.1 C) (Oral)   Resp 18   LMP 01/20/2024   SpO2 99%   Physical Exam Vitals and nursing note reviewed.  Constitutional:      General: She is not in acute distress.    Appearance: She is well-developed.  HENT:     Head: Normocephalic and atraumatic.     Right Ear: There is impacted cerumen.  Left Ear: There is impacted cerumen.     Nose: Nose normal. No congestion.     Mouth/Throat:     Mouth: Mucous membranes are moist.     Pharynx: No oropharyngeal exudate or posterior oropharyngeal erythema.     Tonsils: No tonsillar exudate or tonsillar abscesses. 1+ on the right. 1+ on the left.     Comments: Few right tonsilliths visible Eyes:     Conjunctiva/sclera: Conjunctivae normal.  Cardiovascular:     Rate and Rhythm: Normal rate and regular rhythm.     Heart sounds: No murmur heard. Pulmonary:     Effort:  Pulmonary effort is normal. No respiratory distress.     Breath sounds: Normal breath sounds.  Abdominal:     Palpations: Abdomen is soft.     Tenderness: There is no abdominal tenderness.  Musculoskeletal:        General: No swelling.     Cervical back: Neck supple.  Skin:    General: Skin is warm and dry.     Capillary Refill: Capillary refill takes less than 2 seconds.  Neurological:     Mental Status: She is alert.  Psychiatric:        Mood and Affect: Mood normal.     (all labs ordered are listed, but only abnormal results are displayed) Labs Reviewed  RESP PANEL BY RT-PCR (RSV, FLU A&B, COVID)  [id number]  GROUP A STREP BY PCR   EKG: None  Radiology: DG Chest 2 View Result Date: 01/21/2024 EXAM: 2 VIEW(S) XRAY OF THE CHEST 01/21/2024 05:31:00 AM COMPARISON: 11/05/2023 CLINICAL HISTORY: Cough. Persistent cough for three days, sore throat. FINDINGS: LUNGS AND PLEURA: No focal pulmonary opacity. No pulmonary edema. No pleural effusion. No pneumothorax. HEART AND MEDIASTINUM: No acute abnormality of the cardiac and mediastinal silhouettes. BONES AND SOFT TISSUES: No acute osseous abnormality. IMPRESSION: 1. Normal chest radiograph. Electronically signed by: Waddell Calk MD 01/21/2024 05:49 AM EDT RP Workstation: [id number]     Procedures   Medications Ordered in the ED - No data to display                                  Medical Decision Making Anita Mcbride is a 23 yo female with no pertinent PMH presenting with cough and throat pain for 2 days.  Patient is well-hydrated with no evidence of respiratory distress and unremarkable physical exam including lungs on auscultation.  CXR without evidence of pneumonia.  Did notice some likely tonsil stones on right side, but no exudate and no swelling.  Rapid strep test negative.  Quad respiratory swab negative for RSV, influenza A/B, COVID.  Favor other URI.  No increased risk factors for lung disease.  Discharge home with Afrin  and Robitussin, instructions for supportive care including hydration, honey, hot showers, humidifier.  Recommended PCP follow-up.  Amount and/or Complexity of Data Reviewed Labs: ordered. Decision-making details documented in ED Course. Radiology: ordered and independent interpretation performed. Decision-making details documented in ED Course. ECG/medicine tests: ordered and independent interpretation performed. Decision-making details documented in ED Course.  Risk OTC drugs.      Final diagnoses:  None    ED Discharge Orders     None        Lenise Jr, MD 01/21/24 1030    Elnor Savant A, DO 01/21/24 1317

## 2024-01-22 NOTE — Progress Notes (Signed)
 Complex Care Management Note Care Guide Note  01/22/2024 Name: Anita Mcbride MRN: [id number] DOB: May 12, 2000   Complex Care Management Outreach Attempts: A second unsuccessful outreach was attempted today to offer the patient with information about available complex care management services.  Follow Up Plan:  Additional outreach attempts will be made to offer the patient complex care management information and services.   Encounter Outcome:  No Answer  Dreama Lynwood [facility]  [facility], [facility] VBCI Assistant Direct Dial: [phone]  Fax: [phone]

## 2024-01-27 NOTE — Progress Notes (Signed)
 Complex Care Management Note Care Guide Note  01/27/2024 Name: Anita Mcbride MRN: [id number] DOB: 2000/04/12   Complex Care Management Outreach Attempts: A third unsuccessful outreach was attempted today to offer the patient with information about available complex care management services.  Follow Up Plan:  No further outreach attempts will be made at this time. We have been unable to contact the patient to offer or enroll patient in complex care management services.  Encounter Outcome:  No Answer  Dreama Lynwood [facility]  [facility], [facility] VBCI Assistant Direct Dial: [phone]  Fax: [phone]
